# Patient Record
Sex: Male | Born: 1955 | Race: White | Hispanic: No | Marital: Married | Smoking: Never smoker
Health system: Southern US, Community
[De-identification: ages and names within clinical notes are randomized; demographics above are authoritative.]

## PROBLEM LIST (undated history)

## (undated) DIAGNOSIS — C61 Malignant neoplasm of prostate: Secondary | ICD-10-CM

## (undated) DIAGNOSIS — Z8579 Personal history of other malignant neoplasms of lymphoid, hematopoietic and related tissues: Secondary | ICD-10-CM

## (undated) DIAGNOSIS — C8590 Non-Hodgkin lymphoma, unspecified, unspecified site: Secondary | ICD-10-CM

## (undated) DIAGNOSIS — I10 Essential (primary) hypertension: Secondary | ICD-10-CM

## (undated) HISTORY — DX: Non-Hodgkin lymphoma, unspecified, unspecified site: C85.90

## (undated) HISTORY — DX: Essential (primary) hypertension: I10

## (undated) HISTORY — DX: Personal history of other malignant neoplasms of lymphoid, hematopoietic and related tissues: Z85.79

## (undated) HISTORY — DX: Malignant neoplasm of prostate: C61

## (undated) HISTORY — PX: PROSTATECTOMY: SHX69

---

## 1998-10-13 DIAGNOSIS — C61 Malignant neoplasm of prostate: Secondary | ICD-10-CM

## 1998-10-13 HISTORY — DX: Malignant neoplasm of prostate: C61

## 1998-10-24 ENCOUNTER — Other Ambulatory Visit: Admission: RE | Admit: 1998-10-24 | Discharge: 1998-10-24 | Payer: Self-pay | Admitting: Urology

## 1998-11-16 ENCOUNTER — Encounter: Payer: Self-pay | Admitting: Urology

## 1998-11-21 ENCOUNTER — Inpatient Hospital Stay (HOSPITAL_COMMUNITY): Admission: RE | Admit: 1998-11-21 | Discharge: 1998-11-25 | Payer: Self-pay | Admitting: Urology

## 2005-10-08 ENCOUNTER — Ambulatory Visit (HOSPITAL_COMMUNITY): Admission: RE | Admit: 2005-10-08 | Discharge: 2005-10-08 | Payer: Self-pay | Admitting: Psychiatry

## 2007-10-14 DIAGNOSIS — C8590 Non-Hodgkin lymphoma, unspecified, unspecified site: Secondary | ICD-10-CM

## 2007-10-14 HISTORY — DX: Non-Hodgkin lymphoma, unspecified, unspecified site: C85.90

## 2008-06-29 ENCOUNTER — Encounter: Admission: RE | Admit: 2008-06-29 | Discharge: 2008-06-29 | Payer: Self-pay | Admitting: Internal Medicine

## 2008-06-30 ENCOUNTER — Ambulatory Visit (HOSPITAL_COMMUNITY): Admission: RE | Admit: 2008-06-30 | Discharge: 2008-06-30 | Payer: Self-pay | Admitting: Internal Medicine

## 2008-06-30 ENCOUNTER — Encounter (INDEPENDENT_AMBULATORY_CARE_PROVIDER_SITE_OTHER): Payer: Self-pay | Admitting: Internal Medicine

## 2008-06-30 ENCOUNTER — Encounter (INDEPENDENT_AMBULATORY_CARE_PROVIDER_SITE_OTHER): Payer: Self-pay | Admitting: Interventional Radiology

## 2008-07-07 ENCOUNTER — Ambulatory Visit: Payer: Self-pay | Admitting: Hematology

## 2008-07-10 ENCOUNTER — Inpatient Hospital Stay (HOSPITAL_COMMUNITY): Admission: RE | Admit: 2008-07-10 | Discharge: 2008-07-12 | Payer: Self-pay | Admitting: Surgery

## 2008-07-10 ENCOUNTER — Encounter (INDEPENDENT_AMBULATORY_CARE_PROVIDER_SITE_OTHER): Payer: Self-pay | Admitting: Surgery

## 2008-07-13 ENCOUNTER — Ambulatory Visit: Payer: Self-pay | Admitting: Oncology

## 2008-07-13 ENCOUNTER — Encounter: Payer: Self-pay | Admitting: Oncology

## 2008-07-13 ENCOUNTER — Ambulatory Visit (HOSPITAL_COMMUNITY): Admission: RE | Admit: 2008-07-13 | Discharge: 2008-07-13 | Payer: Self-pay | Admitting: Oncology

## 2008-07-13 LAB — CBC WITH DIFFERENTIAL/PLATELET
Basophils Absolute: 0 10*3/uL (ref 0.0–0.1)
EOS%: 1.4 % (ref 0.0–7.0)
LYMPH%: 8 % — ABNORMAL LOW (ref 14.0–48.0)
MCH: 28.9 pg (ref 28.0–33.4)
MCV: 84.6 fL (ref 81.6–98.0)
MONO%: 6.3 % (ref 0.0–13.0)
RBC: 4.75 10*6/uL (ref 4.20–5.71)
RDW: 13 % (ref 11.2–14.6)

## 2008-07-14 LAB — HEPATITIS B SURFACE ANTIBODY,QUALITATIVE: Hep B S Ab: NEGATIVE

## 2008-07-14 LAB — COMPREHENSIVE METABOLIC PANEL
ALT: 15 U/L (ref 0–53)
Albumin: 3.3 g/dL — ABNORMAL LOW (ref 3.5–5.2)
CO2: 26 mEq/L (ref 19–32)
Calcium: 8.9 mg/dL (ref 8.4–10.5)
Chloride: 105 mEq/L (ref 96–112)
Potassium: 3.9 mEq/L (ref 3.5–5.3)
Sodium: 140 mEq/L (ref 135–145)
Total Protein: 5.5 g/dL — ABNORMAL LOW (ref 6.0–8.3)

## 2008-07-14 LAB — URIC ACID: Uric Acid, Serum: 10 mg/dL — ABNORMAL HIGH (ref 4.0–7.8)

## 2008-07-14 LAB — LACTATE DEHYDROGENASE: LDH: 196 U/L (ref 94–250)

## 2008-07-14 LAB — PSA: PSA: 0.02 ng/mL — ABNORMAL LOW (ref 0.10–4.00)

## 2008-07-14 LAB — HEPATITIS C ANTIBODY: HCV Ab: NEGATIVE

## 2008-07-17 ENCOUNTER — Ambulatory Visit (HOSPITAL_COMMUNITY): Admission: RE | Admit: 2008-07-17 | Discharge: 2008-07-17 | Payer: Self-pay | Admitting: Oncology

## 2008-07-17 LAB — BASIC METABOLIC PANEL
BUN: 12 mg/dL (ref 6–23)
CO2: 26 mEq/L (ref 19–32)
Chloride: 100 mEq/L (ref 96–112)
Creatinine, Ser: 1.07 mg/dL (ref 0.40–1.50)

## 2008-07-21 LAB — BASIC METABOLIC PANEL
BUN: 18 mg/dL (ref 6–23)
Glucose, Bld: 97 mg/dL (ref 70–99)
Potassium: 4.2 mEq/L (ref 3.5–5.3)

## 2008-07-21 LAB — PHOSPHORUS: Phosphorus: 2.7 mg/dL (ref 2.3–4.6)

## 2008-07-24 LAB — BASIC METABOLIC PANEL
CO2: 23 mEq/L (ref 19–32)
Chloride: 102 mEq/L (ref 96–112)
Glucose, Bld: 93 mg/dL (ref 70–99)
Potassium: 3.9 mEq/L (ref 3.5–5.3)
Sodium: 137 mEq/L (ref 135–145)

## 2008-07-26 LAB — COMPREHENSIVE METABOLIC PANEL
Albumin: 4.1 g/dL (ref 3.5–5.2)
Alkaline Phosphatase: 64 U/L (ref 39–117)
BUN: 11 mg/dL (ref 6–23)
CO2: 26 mEq/L (ref 19–32)
Glucose, Bld: 91 mg/dL (ref 70–99)
Potassium: 4.7 mEq/L (ref 3.5–5.3)
Sodium: 139 mEq/L (ref 135–145)
Total Protein: 6.3 g/dL (ref 6.0–8.3)

## 2008-07-26 LAB — CBC WITH DIFFERENTIAL/PLATELET
Basophils Absolute: 0 10*3/uL (ref 0.0–0.1)
Eosinophils Absolute: 0.1 10*3/uL (ref 0.0–0.5)
HGB: 13.6 g/dL (ref 13.0–17.1)
LYMPH%: 12.4 % — ABNORMAL LOW (ref 14.0–48.0)
MCV: 84.4 fL (ref 81.6–98.0)
MONO#: 0.6 10*3/uL (ref 0.1–0.9)
MONO%: 6.2 % (ref 0.0–13.0)
NEUT#: 7.1 10*3/uL — ABNORMAL HIGH (ref 1.5–6.5)
Platelets: 313 10*3/uL (ref 145–400)
WBC: 8.9 10*3/uL (ref 4.0–10.0)

## 2008-07-26 LAB — PHOSPHORUS: Phosphorus: 4.2 mg/dL (ref 2.3–4.6)

## 2008-08-02 LAB — CBC WITH DIFFERENTIAL/PLATELET
EOS%: 1.8 % (ref 0.0–7.0)
Eosinophils Absolute: 0.1 10*3/uL (ref 0.0–0.5)
LYMPH%: 32.7 % (ref 14.0–48.0)
MCH: 28.8 pg (ref 28.0–33.4)
MCHC: 34.6 g/dL (ref 32.0–35.9)
MCV: 83.2 fL (ref 81.6–98.0)
MONO%: 12.5 % (ref 0.0–13.0)
NEUT#: 2.7 10*3/uL (ref 1.5–6.5)
Platelets: 246 10*3/uL (ref 145–400)
RBC: 4.59 10*6/uL (ref 4.20–5.71)
RDW: 13.1 % (ref 11.2–14.6)

## 2008-08-02 LAB — LACTATE DEHYDROGENASE: LDH: 126 U/L (ref 94–250)

## 2008-08-02 LAB — PHOSPHORUS: Phosphorus: 4 mg/dL (ref 2.3–4.6)

## 2008-08-09 LAB — LACTATE DEHYDROGENASE: LDH: 179 U/L (ref 94–250)

## 2008-08-09 LAB — CBC WITH DIFFERENTIAL/PLATELET
BASO%: 1.4 % (ref 0.0–2.0)
Basophils Absolute: 0.1 10*3/uL (ref 0.0–0.1)
EOS%: 2.4 % (ref 0.0–7.0)
HGB: 13.7 g/dL (ref 13.0–17.1)
MCH: 28.1 pg (ref 28.0–33.4)
MCHC: 34.5 g/dL (ref 32.0–35.9)
MCV: 81.4 fL — ABNORMAL LOW (ref 81.6–98.0)
MONO%: 8 % (ref 0.0–13.0)
NEUT%: 56.1 % (ref 40.0–75.0)
RDW: 12.7 % (ref 11.2–14.6)
lymph#: 2.4 10*3/uL (ref 0.9–3.3)

## 2008-08-09 LAB — COMPREHENSIVE METABOLIC PANEL
ALT: 43 U/L (ref 0–53)
AST: 31 U/L (ref 0–37)
Alkaline Phosphatase: 84 U/L (ref 39–117)
CO2: 25 mEq/L (ref 19–32)
Sodium: 142 mEq/L (ref 135–145)
Total Bilirubin: 0.5 mg/dL (ref 0.3–1.2)
Total Protein: 7.3 g/dL (ref 6.0–8.3)

## 2008-08-21 LAB — CBC WITH DIFFERENTIAL/PLATELET
Eosinophils Absolute: 0.1 10*3/uL (ref 0.0–0.5)
MONO#: 0.6 10*3/uL (ref 0.1–0.9)
NEUT#: 3 10*3/uL (ref 1.5–6.5)
RBC: 4.74 10*6/uL (ref 4.20–5.71)
RDW: 14 % (ref 11.2–14.6)
WBC: 5.5 10*3/uL (ref 4.0–10.0)
lymph#: 1.7 10*3/uL (ref 0.9–3.3)

## 2008-08-21 LAB — COMPREHENSIVE METABOLIC PANEL
Albumin: 4.5 g/dL (ref 3.5–5.2)
CO2: 26 mEq/L (ref 19–32)
Glucose, Bld: 98 mg/dL (ref 70–99)
Potassium: 4.2 mEq/L (ref 3.5–5.3)
Sodium: 141 mEq/L (ref 135–145)
Total Protein: 7 g/dL (ref 6.0–8.3)

## 2008-08-21 LAB — URIC ACID: Uric Acid, Serum: 3.6 mg/dL — ABNORMAL LOW (ref 4.0–7.8)

## 2008-08-28 ENCOUNTER — Ambulatory Visit: Payer: Self-pay | Admitting: Hematology

## 2008-08-30 LAB — CBC WITH DIFFERENTIAL/PLATELET
BASO%: 1.4 % (ref 0.0–2.0)
EOS%: 2.1 % (ref 0.0–7.0)
HCT: 38.6 % — ABNORMAL LOW (ref 38.7–49.9)
LYMPH%: 33.5 % (ref 14.0–48.0)
MCH: 28.8 pg (ref 28.0–33.4)
MCHC: 35 g/dL (ref 32.0–35.9)
MCV: 82.2 fL (ref 81.6–98.0)
NEUT%: 55.2 % (ref 40.0–75.0)
Platelets: 233 10*3/uL (ref 145–400)

## 2008-08-30 LAB — COMPREHENSIVE METABOLIC PANEL
ALT: 43 U/L (ref 0–53)
Alkaline Phosphatase: 56 U/L (ref 39–117)
Creatinine, Ser: 0.87 mg/dL (ref 0.40–1.50)
Glucose, Bld: 102 mg/dL — ABNORMAL HIGH (ref 70–99)
Sodium: 140 mEq/L (ref 135–145)
Total Bilirubin: 0.5 mg/dL (ref 0.3–1.2)
Total Protein: 6 g/dL (ref 6.0–8.3)

## 2008-08-30 LAB — URIC ACID: Uric Acid, Serum: 3.6 mg/dL — ABNORMAL LOW (ref 4.0–7.8)

## 2008-09-20 LAB — CBC WITH DIFFERENTIAL/PLATELET
Basophils Absolute: 0.1 10*3/uL (ref 0.0–0.1)
HCT: 39.7 % (ref 38.7–49.9)
HGB: 14.1 g/dL (ref 13.0–17.1)
MONO#: 0.5 10*3/uL (ref 0.1–0.9)
NEUT#: 3.5 10*3/uL (ref 1.5–6.5)
NEUT%: 56.5 % (ref 40.0–75.0)
RDW: 13.8 % (ref 11.2–14.6)
WBC: 6.2 10*3/uL (ref 4.0–10.0)
lymph#: 2 10*3/uL (ref 0.9–3.3)

## 2008-09-20 LAB — COMPREHENSIVE METABOLIC PANEL
ALT: 47 U/L (ref 0–53)
Albumin: 4.4 g/dL (ref 3.5–5.2)
BUN: 10 mg/dL (ref 6–23)
CO2: 24 mEq/L (ref 19–32)
Calcium: 9.2 mg/dL (ref 8.4–10.5)
Chloride: 107 mEq/L (ref 96–112)
Creatinine, Ser: 0.91 mg/dL (ref 0.40–1.50)
Potassium: 3.9 mEq/L (ref 3.5–5.3)

## 2008-10-09 ENCOUNTER — Ambulatory Visit: Payer: Self-pay | Admitting: Hematology

## 2008-10-11 LAB — CBC WITH DIFFERENTIAL/PLATELET
BASO%: 1.4 % (ref 0.0–2.0)
EOS%: 1.9 % (ref 0.0–7.0)
HCT: 38.2 % — ABNORMAL LOW (ref 38.7–49.9)
MCH: 29.1 pg (ref 28.0–33.4)
MCHC: 35.7 g/dL (ref 32.0–35.9)
MONO#: 0.6 10*3/uL (ref 0.1–0.9)
NEUT%: 63.6 % (ref 40.0–75.0)
RDW: 13.4 % (ref 11.2–14.6)
WBC: 6.8 10*3/uL (ref 4.0–10.0)
lymph#: 1.7 10*3/uL (ref 0.9–3.3)

## 2008-10-11 LAB — COMPREHENSIVE METABOLIC PANEL
ALT: 35 U/L (ref 0–53)
AST: 19 U/L (ref 0–37)
Albumin: 4.8 g/dL (ref 3.5–5.2)
CO2: 25 mEq/L (ref 19–32)
Calcium: 9.1 mg/dL (ref 8.4–10.5)
Chloride: 105 mEq/L (ref 96–112)
Creatinine, Ser: 0.96 mg/dL (ref 0.40–1.50)
Potassium: 3.8 mEq/L (ref 3.5–5.3)
Sodium: 143 mEq/L (ref 135–145)
Total Protein: 7.1 g/dL (ref 6.0–8.3)

## 2008-10-11 LAB — LACTATE DEHYDROGENASE: LDH: 161 U/L (ref 94–250)

## 2008-11-29 ENCOUNTER — Encounter: Admission: RE | Admit: 2008-11-29 | Discharge: 2008-11-29 | Payer: Self-pay | Admitting: Oncology

## 2008-11-29 ENCOUNTER — Ambulatory Visit: Payer: Self-pay | Admitting: Oncology

## 2008-12-05 ENCOUNTER — Ambulatory Visit (HOSPITAL_COMMUNITY): Admission: RE | Admit: 2008-12-05 | Discharge: 2008-12-05 | Payer: Self-pay | Admitting: Oncology

## 2008-12-05 LAB — COMPREHENSIVE METABOLIC PANEL
Albumin: 4.5 g/dL (ref 3.5–5.2)
Alkaline Phosphatase: 61 U/L (ref 39–117)
BUN: 12 mg/dL (ref 6–23)
CO2: 25 mEq/L (ref 19–32)
Calcium: 9.4 mg/dL (ref 8.4–10.5)
Chloride: 107 mEq/L (ref 96–112)
Glucose, Bld: 96 mg/dL (ref 70–99)
Potassium: 4.5 mEq/L (ref 3.5–5.3)
Sodium: 143 mEq/L (ref 135–145)
Total Protein: 6.6 g/dL (ref 6.0–8.3)

## 2008-12-05 LAB — CBC WITH DIFFERENTIAL/PLATELET
Basophils Absolute: 0 10*3/uL (ref 0.0–0.1)
Eosinophils Absolute: 0.1 10*3/uL (ref 0.0–0.5)
HGB: 14 g/dL (ref 13.0–17.1)
MONO#: 0.5 10*3/uL (ref 0.1–0.9)
MONO%: 7.2 % (ref 0.0–14.0)
NEUT#: 4.9 10*3/uL (ref 1.5–6.5)
RBC: 4.67 10*6/uL (ref 4.20–5.82)
RDW: 13.6 % (ref 11.0–14.6)
WBC: 7.2 10*3/uL (ref 4.0–10.3)
lymph#: 1.7 10*3/uL (ref 0.9–3.3)

## 2008-12-05 LAB — PSA: PSA: 0.01 ng/mL — ABNORMAL LOW (ref 0.10–4.00)

## 2008-12-15 LAB — CBC WITH DIFFERENTIAL/PLATELET
BASO%: 1 % (ref 0.0–2.0)
Basophils Absolute: 0.1 10*3/uL (ref 0.0–0.1)
EOS%: 2.2 % (ref 0.0–7.0)
HGB: 14.1 g/dL (ref 13.0–17.1)
MCH: 29.1 pg (ref 27.2–33.4)
MCHC: 35.3 g/dL (ref 32.0–36.0)
MCV: 82.3 fL (ref 79.3–98.0)
MONO%: 9.7 % (ref 0.0–14.0)
RDW: 12.9 % (ref 11.0–14.6)
lymph#: 1.8 10*3/uL (ref 0.9–3.3)

## 2008-12-15 LAB — COMPREHENSIVE METABOLIC PANEL
ALT: 21 U/L (ref 0–53)
AST: 19 U/L (ref 0–37)
Albumin: 4.5 g/dL (ref 3.5–5.2)
Alkaline Phosphatase: 49 U/L (ref 39–117)
Calcium: 9.2 mg/dL (ref 8.4–10.5)
Chloride: 109 mEq/L (ref 96–112)
Potassium: 4.6 mEq/L (ref 3.5–5.3)
Sodium: 141 mEq/L (ref 135–145)
Total Protein: 6.7 g/dL (ref 6.0–8.3)

## 2008-12-22 LAB — COMPREHENSIVE METABOLIC PANEL
ALT: 24 U/L (ref 0–53)
AST: 17 U/L (ref 0–37)
Alkaline Phosphatase: 56 U/L (ref 39–117)
Creatinine, Ser: 0.92 mg/dL (ref 0.40–1.50)
Sodium: 141 mEq/L (ref 135–145)
Total Bilirubin: 0.5 mg/dL (ref 0.3–1.2)
Total Protein: 6.6 g/dL (ref 6.0–8.3)

## 2008-12-22 LAB — CBC WITH DIFFERENTIAL/PLATELET
BASO%: 0.4 % (ref 0.0–2.0)
LYMPH%: 25 % (ref 14.0–49.0)
MCHC: 35.4 g/dL (ref 32.0–36.0)
MCV: 82.2 fL (ref 79.3–98.0)
MONO#: 0.3 10*3/uL (ref 0.1–0.9)
MONO%: 5.3 % (ref 0.0–14.0)
NEUT#: 3.6 10*3/uL (ref 1.5–6.5)
Platelets: 175 10*3/uL (ref 140–400)
RBC: 4.84 10*6/uL (ref 4.20–5.82)
RDW: 12.8 % (ref 11.0–14.6)
WBC: 5.3 10*3/uL (ref 4.0–10.3)
nRBC: 0 % (ref 0–0)

## 2008-12-29 LAB — COMPREHENSIVE METABOLIC PANEL
ALT: 30 U/L (ref 0–53)
AST: 17 U/L (ref 0–37)
Alkaline Phosphatase: 63 U/L (ref 39–117)
BUN: 13 mg/dL (ref 6–23)
Creatinine, Ser: 0.96 mg/dL (ref 0.40–1.50)

## 2008-12-29 LAB — CBC WITH DIFFERENTIAL/PLATELET
Basophils Absolute: 0 10*3/uL (ref 0.0–0.1)
EOS%: 2.2 % (ref 0.0–7.0)
HCT: 40.1 % (ref 38.4–49.9)
HGB: 14.2 g/dL (ref 13.0–17.1)
LYMPH%: 27.4 % (ref 14.0–49.0)
MCH: 28.9 pg (ref 27.2–33.4)
MCV: 81.5 fL (ref 79.3–98.0)
MONO%: 8.4 % (ref 0.0–14.0)
NEUT%: 61.3 % (ref 39.0–75.0)
Platelets: 186 10*3/uL (ref 140–400)

## 2009-01-05 LAB — COMPREHENSIVE METABOLIC PANEL
Albumin: 4.4 g/dL (ref 3.5–5.2)
Alkaline Phosphatase: 55 U/L (ref 39–117)
BUN: 12 mg/dL (ref 6–23)
Creatinine, Ser: 0.99 mg/dL (ref 0.40–1.50)
Glucose, Bld: 112 mg/dL — ABNORMAL HIGH (ref 70–99)
Potassium: 4.1 mEq/L (ref 3.5–5.3)
Total Bilirubin: 0.7 mg/dL (ref 0.3–1.2)

## 2009-01-05 LAB — CBC WITH DIFFERENTIAL/PLATELET
Basophils Absolute: 0 10*3/uL (ref 0.0–0.1)
Eosinophils Absolute: 0.1 10*3/uL (ref 0.0–0.5)
HGB: 14.5 g/dL (ref 13.0–17.1)
MCV: 81.8 fL (ref 79.3–98.0)
MONO%: 5.8 % (ref 0.0–14.0)
NEUT#: 3.4 10*3/uL (ref 1.5–6.5)
RDW: 12.4 % (ref 11.0–14.6)

## 2009-01-08 LAB — CBC WITH DIFFERENTIAL/PLATELET
Eosinophils Absolute: 0.1 10*3/uL (ref 0.0–0.5)
HCT: 41.6 % (ref 38.4–49.9)
LYMPH%: 27.5 % (ref 14.0–49.0)
MONO#: 0.4 10*3/uL (ref 0.1–0.9)
NEUT#: 3.4 10*3/uL (ref 1.5–6.5)
NEUT%: 63.8 % (ref 39.0–75.0)
Platelets: 190 10*3/uL (ref 140–400)
WBC: 5.4 10*3/uL (ref 4.0–10.3)

## 2009-01-08 LAB — COMPREHENSIVE METABOLIC PANEL
Albumin: 4.8 g/dL (ref 3.5–5.2)
BUN: 9 mg/dL (ref 6–23)
Calcium: 9.5 mg/dL (ref 8.4–10.5)
Chloride: 107 mEq/L (ref 96–112)
Creatinine, Ser: 0.98 mg/dL (ref 0.40–1.50)
Glucose, Bld: 114 mg/dL — ABNORMAL HIGH (ref 70–99)
Potassium: 4.3 mEq/L (ref 3.5–5.3)

## 2009-01-08 LAB — LACTATE DEHYDROGENASE: LDH: 133 U/L (ref 94–250)

## 2009-04-11 ENCOUNTER — Ambulatory Visit: Payer: Self-pay | Admitting: Oncology

## 2009-04-13 LAB — COMPREHENSIVE METABOLIC PANEL
AST: 22 U/L (ref 0–37)
Albumin: 4.4 g/dL (ref 3.5–5.2)
BUN: 11 mg/dL (ref 6–23)
CO2: 23 mEq/L (ref 19–32)
Calcium: 9.1 mg/dL (ref 8.4–10.5)
Chloride: 109 mEq/L (ref 96–112)
Creatinine, Ser: 0.94 mg/dL (ref 0.40–1.50)
Glucose, Bld: 97 mg/dL (ref 70–99)
Potassium: 4.3 mEq/L (ref 3.5–5.3)

## 2009-04-13 LAB — CBC WITH DIFFERENTIAL/PLATELET
Eosinophils Absolute: 0.1 10*3/uL (ref 0.0–0.5)
MONO#: 0.4 10*3/uL (ref 0.1–0.9)
NEUT#: 3.8 10*3/uL (ref 1.5–6.5)
RBC: 5.06 10*6/uL (ref 4.20–5.82)
RDW: 13.4 % (ref 11.0–14.6)
WBC: 5.8 10*3/uL (ref 4.0–10.3)
lymph#: 1.5 10*3/uL (ref 0.9–3.3)

## 2009-04-13 LAB — LACTATE DEHYDROGENASE: LDH: 128 U/L (ref 94–250)

## 2009-04-17 ENCOUNTER — Encounter: Admission: RE | Admit: 2009-04-17 | Discharge: 2009-04-17 | Payer: Self-pay | Admitting: Oncology

## 2009-06-20 ENCOUNTER — Ambulatory Visit: Payer: Self-pay | Admitting: Oncology

## 2009-06-22 LAB — COMPREHENSIVE METABOLIC PANEL
Albumin: 4.7 g/dL (ref 3.5–5.2)
Alkaline Phosphatase: 61 U/L (ref 39–117)
BUN: 10 mg/dL (ref 6–23)
Calcium: 9.3 mg/dL (ref 8.4–10.5)
Chloride: 109 mEq/L (ref 96–112)
Glucose, Bld: 91 mg/dL (ref 70–99)
Potassium: 4.2 mEq/L (ref 3.5–5.3)

## 2009-06-22 LAB — CBC WITH DIFFERENTIAL/PLATELET
BASO%: 0.5 % (ref 0.0–2.0)
Basophils Absolute: 0 10*3/uL (ref 0.0–0.1)
EOS%: 1.2 % (ref 0.0–7.0)
Eosinophils Absolute: 0.1 10*3/uL (ref 0.0–0.5)
HCT: 42.7 % (ref 38.4–49.9)
HGB: 15.4 g/dL (ref 13.0–17.1)
LYMPH%: 25.3 % (ref 14.0–49.0)
MCH: 29.7 pg (ref 27.2–33.4)
MCHC: 36.1 g/dL — ABNORMAL HIGH (ref 32.0–36.0)
MCV: 82.4 fL (ref 79.3–98.0)
MONO#: 0.4 10*3/uL (ref 0.1–0.9)
MONO%: 6.6 % (ref 0.0–14.0)
NEUT#: 4 10*3/uL (ref 1.5–6.5)
NEUT%: 66.4 % (ref 39.0–75.0)
Platelets: 198 10*3/uL (ref 140–400)
RBC: 5.18 10*6/uL (ref 4.20–5.82)
RDW: 12.5 % (ref 11.0–14.6)
WBC: 6.1 10*3/uL (ref 4.0–10.3)
lymph#: 1.5 10*3/uL (ref 0.9–3.3)
nRBC: 0 % (ref 0–0)

## 2009-06-22 LAB — LACTATE DEHYDROGENASE: LDH: 134 U/L (ref 94–250)

## 2009-09-11 ENCOUNTER — Encounter: Admission: RE | Admit: 2009-09-11 | Discharge: 2009-09-11 | Payer: Self-pay | Admitting: Oncology

## 2009-09-11 ENCOUNTER — Ambulatory Visit: Payer: Self-pay | Admitting: Oncology

## 2009-09-14 LAB — CBC WITH DIFFERENTIAL/PLATELET
BASO%: 0.4 % (ref 0.0–2.0)
EOS%: 1.4 % (ref 0.0–7.0)
MCH: 29.7 pg (ref 27.2–33.4)
MCHC: 34.8 g/dL (ref 32.0–36.0)
RBC: 5.16 10*6/uL (ref 4.20–5.82)
RDW: 12.7 % (ref 11.0–14.6)
lymph#: 1.5 10*3/uL (ref 0.9–3.3)

## 2009-09-14 LAB — COMPREHENSIVE METABOLIC PANEL
ALT: 34 U/L (ref 0–53)
AST: 20 U/L (ref 0–37)
Albumin: 4.8 g/dL (ref 3.5–5.2)
Calcium: 9.4 mg/dL (ref 8.4–10.5)
Chloride: 107 mEq/L (ref 96–112)
Potassium: 4.3 mEq/L (ref 3.5–5.3)

## 2009-09-14 LAB — LACTATE DEHYDROGENASE: LDH: 121 U/L (ref 94–250)

## 2009-09-16 IMAGING — PT NM PET TUM IMG SKULL BASE T - THIGH
6 series · 25 of 25 positions shown · non-contrast
Comparison: CT abdomen pelvis 06/29/2008 and CT chest 07/17/2008

CLINICAL DATA: Non-Hodgkins lymphoma.

NUCLEAR MEDICINE PET SKULL BASE TO THIGH
TECHNIQUE: 18.0 mCi F-18 FDG was injected intravenously via the
right antecubital fossa..  Full-ring PET imaging was performed from
the skull base through the mid-thighs 70  minutes after injection.
CT data was obtained and used for attenuation correction and
anatomic localization only.  (This was not acquired as a diagnostic
CT examination.)
Fasting Blood Glucose:  146

[Series 1: pet ac · axial · 3.3mm · 4.69mm/px · z∈[-899,-29]mm · 5 of 267 slices shown]
[im 1/267]
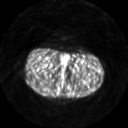
[im 67/267]
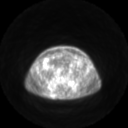
[im 134/267]
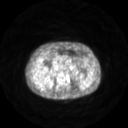
[im 200/267]
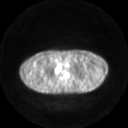
[im 267/267]
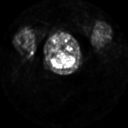

[Series 2: pet nac · axial · 3.3mm · 4.69mm/px · z∈[-899,-29]mm · 6 of 267 slices shown]
[im 1/267]
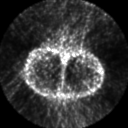
[im 54/267]
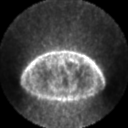
[im 107/267]
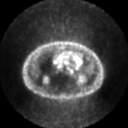
[im 160/267]
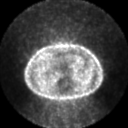
[im 213/267]
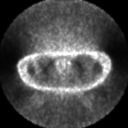
[im 267/267]
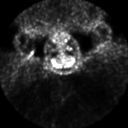

[Series 2: ct images · axial · 3.8mm · 0.98mm/px · z∈[-899,-30]mm · 5 of 267 slices shown]
[im 1/267]
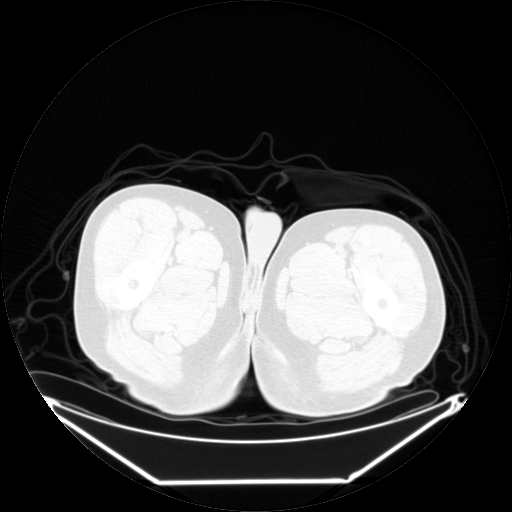
[im 67/267]
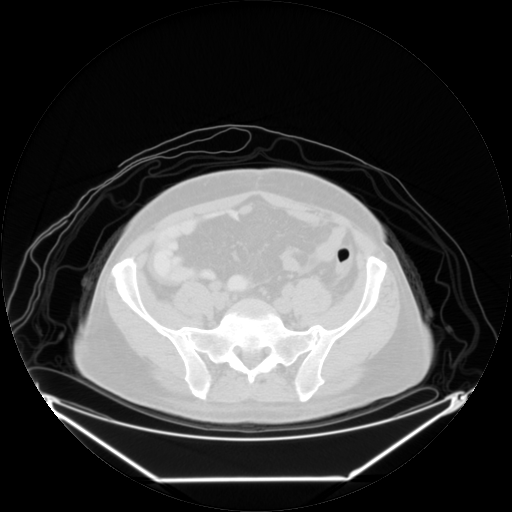
[im 134/267]
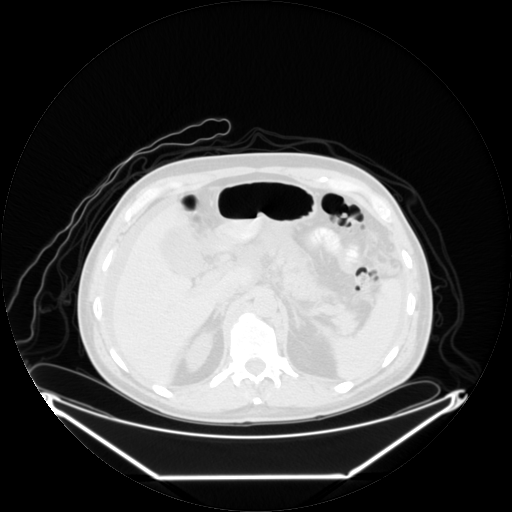
[im 200/267]
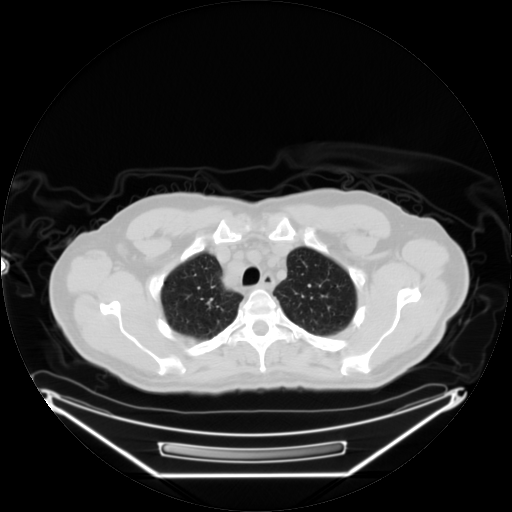
[im 267/267]
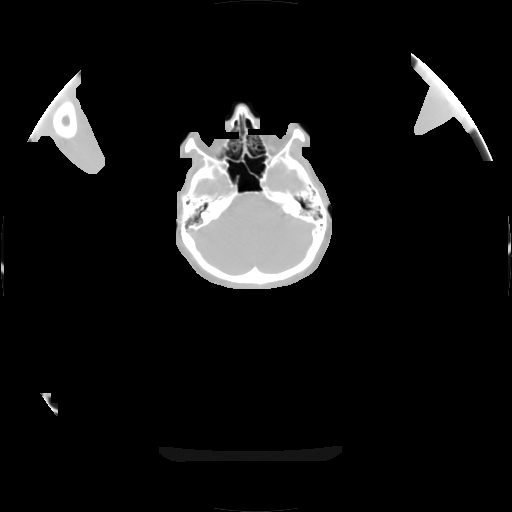

[Series 123: mip · coronal · 3.3mm · 4.69mm/px · 1 of 30 slices shown]
[im 1/30]
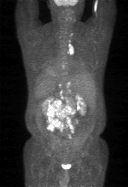

[Series 151: reformatted · axial · 3.3mm · 3.91mm/px · z∈[-899,-29]mm · 6 of 265 slices shown (1 of 2)]
[im 1/265]
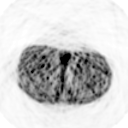
[im 53/265]
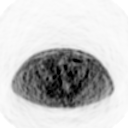
[im 106/265]
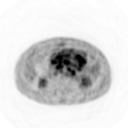
[im 159/265]
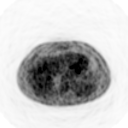
[im 212/265]
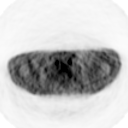
[im 265/265]
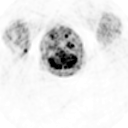

[Series 153: reformatted · coronal · 4.7mm · 6.98mm/px · 2 of 71 slices shown (2 of 2)]
[im 1/71]
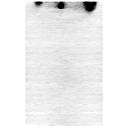
[im 71/71]
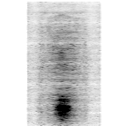

[25 of 25 positions shown; findings below may reference images not displayed]

FINDINGS: There are bilateral low internal jugular lymph nodes,
with maximum S U V seen on the right, measuring 4.8.  Mediastinal
adenopathy has a maximum S U V of 15.6, in the low right
paratracheal station.  Lymph node posterior to the left atrium,
between the esophagus and descending thoracic aorta, has an S U V
of 5.2.  Retrocrural adenopathy has maximum standard uptake value
of 11.1.  Focally increased uptake along the right arm is
presumably related to injection.

Bulky retroperitoneal adenopathy has maximum S U V of 15.9.  Large
mesenteric nodal mass has maximum S U V of 19.6. Mild uptake is
seen along the midline omentum, measuring 3.9.  This may be
postoperative as no corresponding adenopathy is visualized.  Right
common iliac chain lymph node has an S U V of 8.1.  There is mild
patchy increased uptake in the scrotal sac bilaterally.

CT images show bilateral pleural effusions, small in size, and
ascites.  Please refer to diagnostic CT chest performed the same
day, as well as diagnostic CT abdomen pelvis performed 06/29/2008,
for further details and discussion regarding the CT images.
IMPRESSION: 1.  Internal jugular, mediastinal, paraesophageal, retrocrural,
retroperitoneal, mesenteric, and right common iliac hypermetabolic
adenopathy is consistent with lymphoma.
2.  Mild patchy uptake in the scrotal sac bilaterally.  Ultrasound
may be helpful in further evaluation, as clinically indicated.
3.  Bilateral pleural effusions and ascites.
4.  Tiny right renal calculus without obstruction.
5.  Please refer to diagnostic CT chest performed the same day, as
well as diagnostic CT abdomen and pelvis performed 06/29/2008, for
further details and discussion regarding the CT images.

## 2009-09-16 IMAGING — CT CT CHEST W/ CM
2 of 3 series · 15 of 36 positions shown, 18 images · IV contrast (agent unspecified)
Comparison: None

CLINICAL DATA: Non-Hodgkin lymphoma.

CT CHEST WITH CONTRAST
TECHNIQUE: Multidetector CT imaging of the chest was performed
following the standard protocol during bolus administration of
intravenous contrast.
Contrast: 80 ml Hmnipaque-0WW

[Series 2: chest routine 5.0 b40f · axial · 0.74mm/px · z∈[-435,-150]mm · 12 of 69 slices shown, 15 images]
[im 6/69  mediastinal]
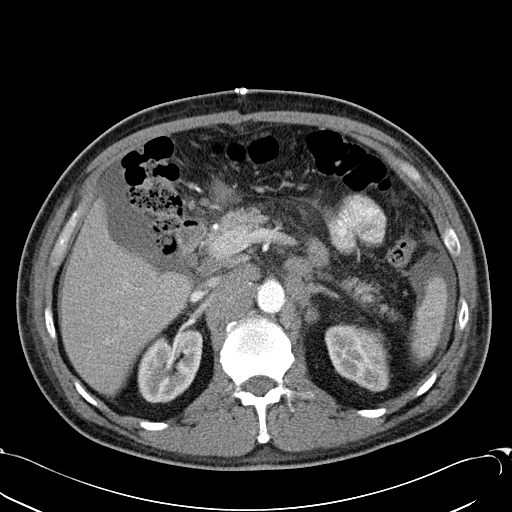
[im 6/69  lung]
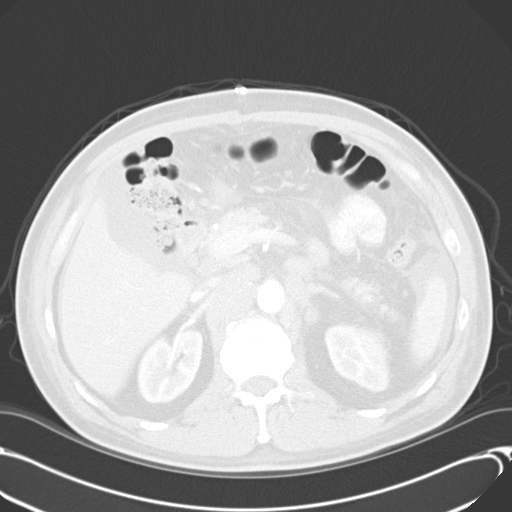
[im 11/69  lung]
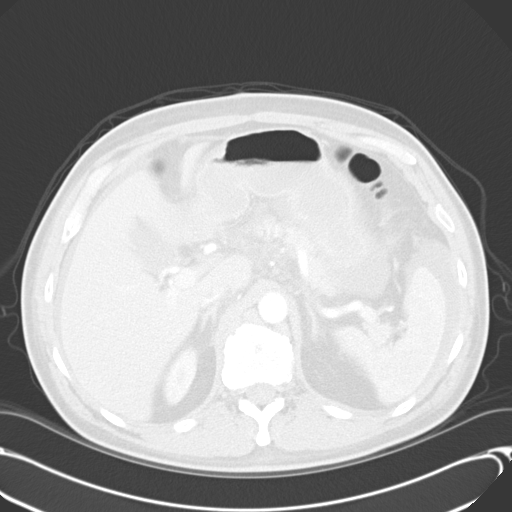
[im 16/69  lung]
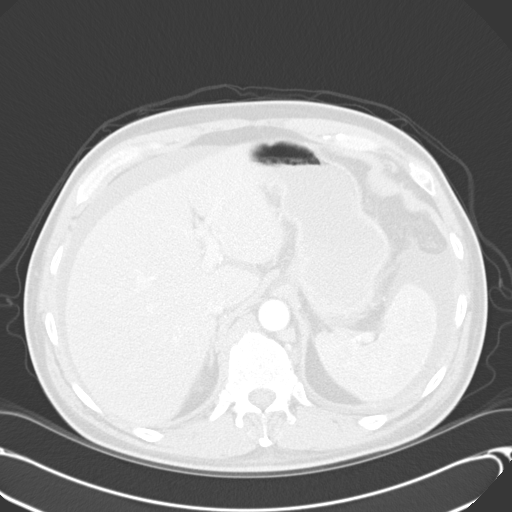
[im 21/69  lung]
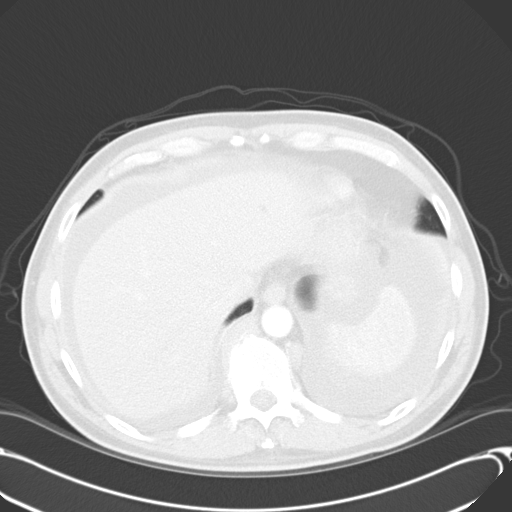
[im 26/69  mediastinal]
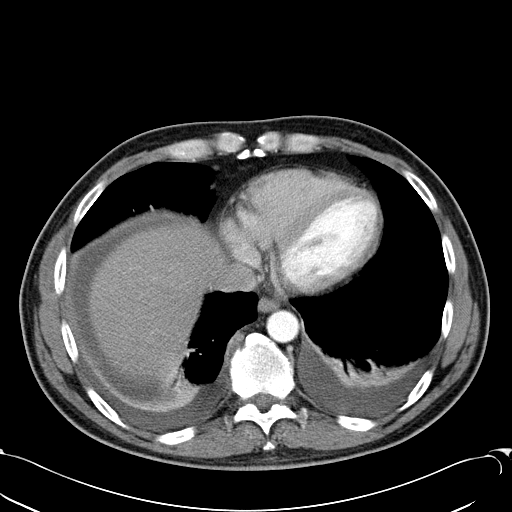
[im 26/69  lung]
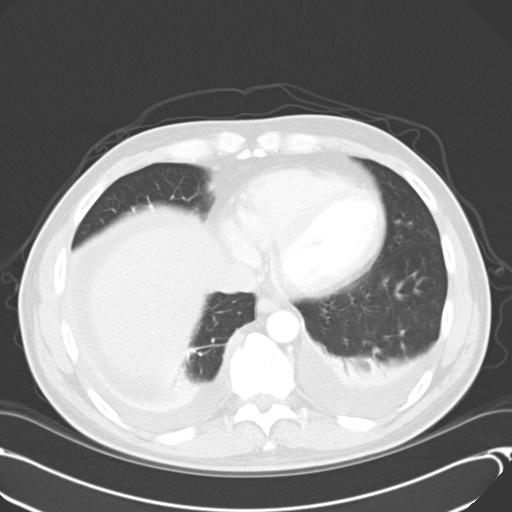
[im 31/69  lung]
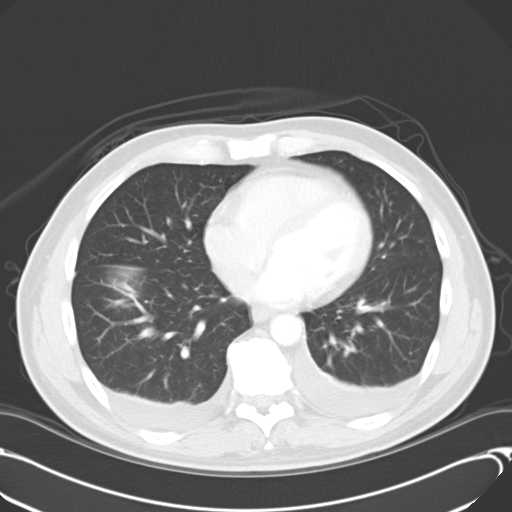
[im 38/69  lung]
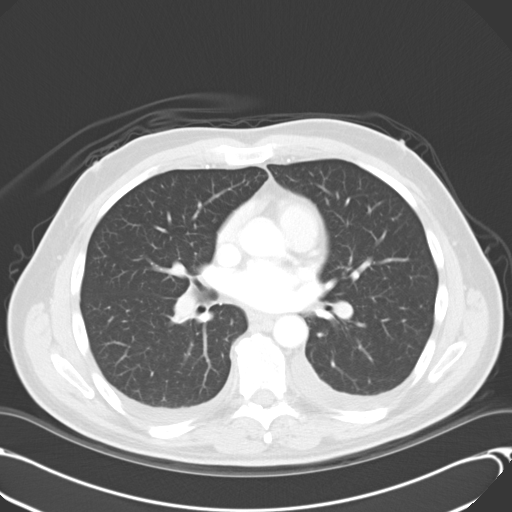
[im 43/69  lung]
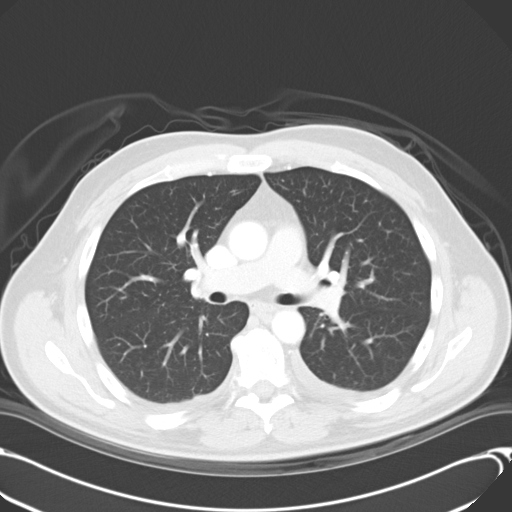
[im 48/69  mediastinal]
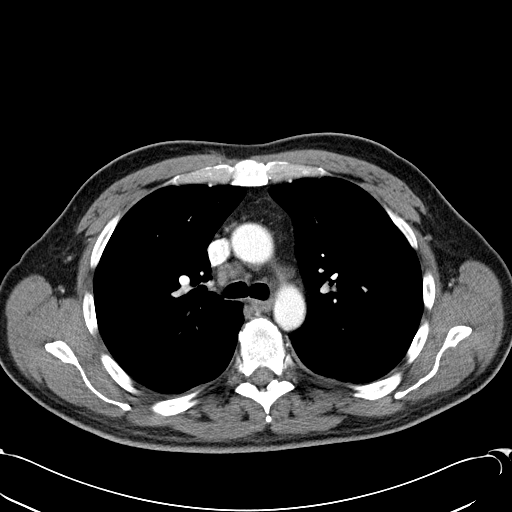
[im 48/69  lung]
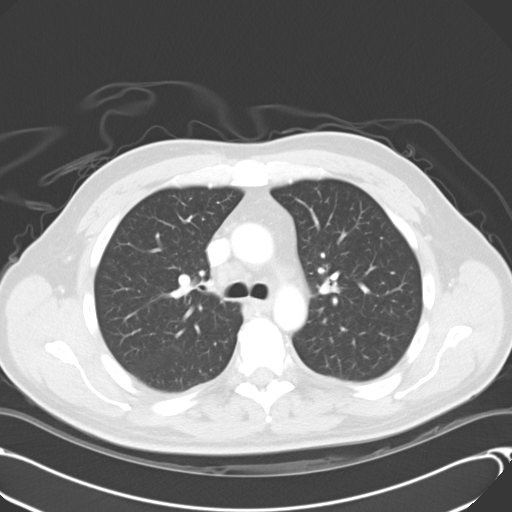
[im 53/69  lung]
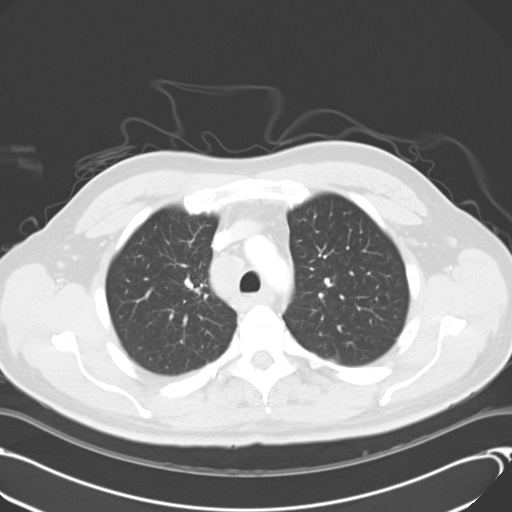
[im 58/69  lung]
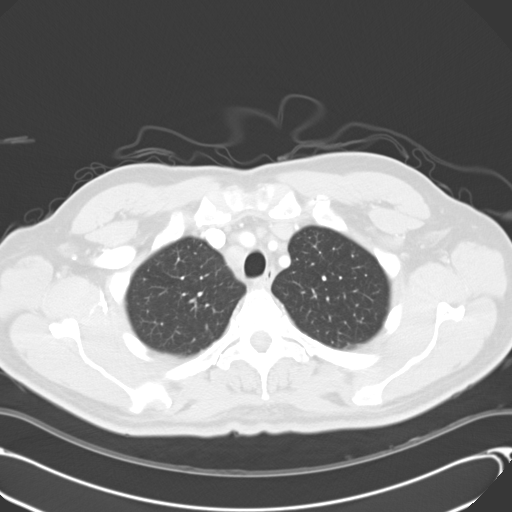
[im 63/69  lung]
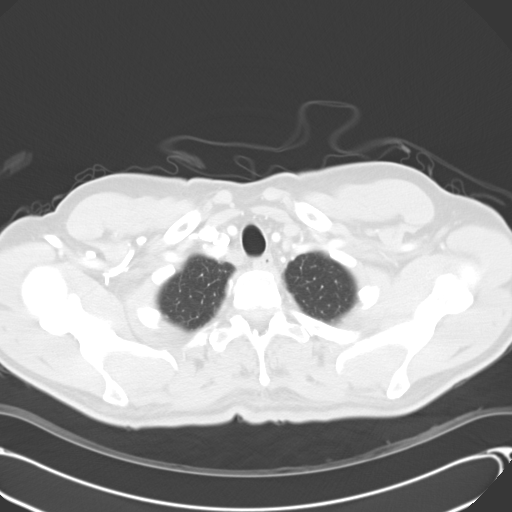

[Series 602: <mpr thick range> · coronal · 0.74mm/px · 3 of 83 slices shown]
[im 17/83  lung]
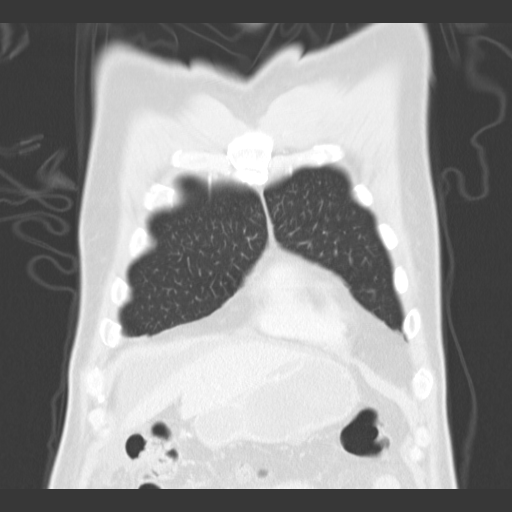
[im 33/83  lung]
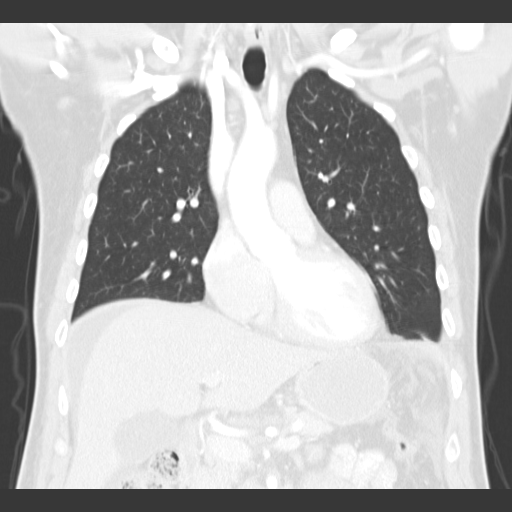
[im 50/83  lung]
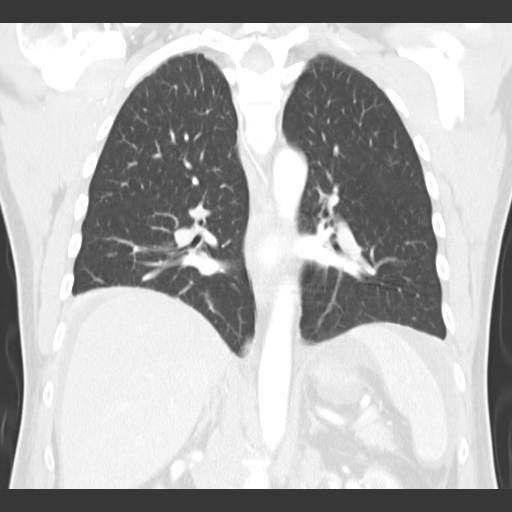

[15 of 36 positions shown; findings below may reference images not displayed]

FINDINGS: Low internal jugular lymph nodes measure 7 mm in short
axis on the right, and 1 cm on the left.  Mediastinal adenopathy
measures up to 2.9 x 2.2 cm in the right paratracheal station.
Lymph node posterior to the left atrium is situated between the
esophagus and descending thoracic aorta, and measures 9 mm in short
axis (image 31).  Extensive retrocrural adenopathy measures up to
1.5 cm short axis.

Heart size normal.  No pericardial effusion.  Small juxta
diaphragmatic lymph node measures 6 mm in short axis.  No axillary
adenopathy.

There are small bilateral pleural effusions, with compressive
atelectasis in the lower lobes bilaterally.  Mild biapical
scarring.  Lungs otherwise clear.  Airway unremarkable.

Incidental imaging of the upper abdomen reveals ascites.  A 5 x 8
mm low attenuation lesion in the left hepatic lobe is too small to
characterize.  Spleen is heterogeneous.  Gastrohepatic ligament
lymph nodes measure up to 8 mm in short axis.  There is extensive
retroperitoneal adenopathy, which is incompletely imaged.
Mesenteric nodal mass is also incompletely imaged.  No worrisome
lytic or sclerotic lesions.
IMPRESSION: 1.  Low internal jugular, mediastinal, paraesophageal, retrocrural,
retroperitoneal and mesenteric adenopathy is most consistent with
lymphoma. Please refer to PET CT performed the same day, as well as
CT abdomen pelvis performed 06/29/2008 for further details and
discussion.
2.  Small bilateral pleural effusions with compressive atelectasis
in the lower lobes.
3.  Ascites.
4.  Heterogeneous spleen.

## 2009-12-11 ENCOUNTER — Ambulatory Visit: Payer: Self-pay | Admitting: Oncology

## 2009-12-14 LAB — CBC WITH DIFFERENTIAL/PLATELET
Basophils Absolute: 0 10*3/uL (ref 0.0–0.1)
EOS%: 0.6 % (ref 0.0–7.0)
Eosinophils Absolute: 0.1 10*3/uL (ref 0.0–0.5)
LYMPH%: 16.6 % (ref 14.0–49.0)
MCH: 31 pg (ref 27.2–33.4)
MCHC: 35.3 g/dL (ref 32.0–36.0)
MCV: 87.9 fL (ref 79.3–98.0)
MONO#: 0.4 10*3/uL (ref 0.1–0.9)
MONO%: 5.2 % (ref 0.0–14.0)
Platelets: 189 10*3/uL (ref 140–400)
RBC: 5.23 10*6/uL (ref 4.20–5.82)
lymph#: 1.3 10*3/uL (ref 0.9–3.3)

## 2009-12-14 LAB — LACTATE DEHYDROGENASE: LDH: 110 U/L (ref 94–250)

## 2009-12-14 LAB — COMPREHENSIVE METABOLIC PANEL
AST: 16 U/L (ref 0–37)
Albumin: 4.8 g/dL (ref 3.5–5.2)
CO2: 21 mEq/L (ref 19–32)
Calcium: 9.2 mg/dL (ref 8.4–10.5)
Chloride: 109 mEq/L (ref 96–112)
Creatinine, Ser: 0.9 mg/dL (ref 0.40–1.50)
Total Protein: 6.9 g/dL (ref 6.0–8.3)

## 2010-04-11 ENCOUNTER — Encounter: Admission: RE | Admit: 2010-04-11 | Discharge: 2010-04-11 | Payer: Self-pay | Admitting: Oncology

## 2010-04-12 ENCOUNTER — Ambulatory Visit: Payer: Self-pay | Admitting: Oncology

## 2010-04-17 LAB — CBC WITH DIFFERENTIAL/PLATELET
Basophils Absolute: 0 10*3/uL (ref 0.0–0.1)
EOS%: 1 % (ref 0.0–7.0)
Eosinophils Absolute: 0.1 10*3/uL (ref 0.0–0.5)
HCT: 45.4 % (ref 38.4–49.9)
HGB: 15.8 g/dL (ref 13.0–17.1)
MCH: 30.9 pg (ref 27.2–33.4)
MCHC: 34.8 g/dL (ref 32.0–36.0)
NEUT%: 77.1 % — ABNORMAL HIGH (ref 39.0–75.0)
Platelets: 178 10*3/uL (ref 140–400)
lymph#: 1.3 10*3/uL (ref 0.9–3.3)

## 2010-04-17 LAB — COMPREHENSIVE METABOLIC PANEL
ALT: 18 U/L (ref 0–53)
AST: 14 U/L (ref 0–37)
Alkaline Phosphatase: 66 U/L (ref 39–117)
BUN: 12 mg/dL (ref 6–23)
Glucose, Bld: 116 mg/dL — ABNORMAL HIGH (ref 70–99)
Potassium: 4 mEq/L (ref 3.5–5.3)

## 2010-07-04 ENCOUNTER — Ambulatory Visit: Payer: Self-pay | Admitting: Oncology

## 2010-07-08 LAB — CBC WITH DIFFERENTIAL/PLATELET
BASO%: 0.6 % (ref 0.0–2.0)
EOS%: 3 % (ref 0.0–7.0)
Eosinophils Absolute: 0.2 10*3/uL (ref 0.0–0.5)
HCT: 42.8 % (ref 38.4–49.9)
HGB: 15.1 g/dL (ref 13.0–17.1)
LYMPH%: 23.9 % (ref 14.0–49.0)
MCHC: 35.3 g/dL (ref 32.0–36.0)
MCV: 85.6 fL (ref 79.3–98.0)
MONO#: 0.5 10*3/uL (ref 0.1–0.9)
MONO%: 6.7 % (ref 0.0–14.0)
NEUT%: 65.8 % (ref 39.0–75.0)
RBC: 5 10*6/uL (ref 4.20–5.82)
RDW: 12.8 % (ref 11.0–14.6)
WBC: 7 10*3/uL (ref 4.0–10.3)
nRBC: 0 % (ref 0–0)

## 2010-07-08 LAB — COMPREHENSIVE METABOLIC PANEL
AST: 15 U/L (ref 0–37)
CO2: 26 mEq/L (ref 19–32)
Chloride: 107 mEq/L (ref 96–112)
Glucose, Bld: 77 mg/dL (ref 70–99)
Sodium: 141 mEq/L (ref 135–145)
Total Protein: 6.3 g/dL (ref 6.0–8.3)

## 2010-07-08 LAB — LACTATE DEHYDROGENASE: LDH: 120 U/L (ref 94–250)

## 2010-10-02 ENCOUNTER — Encounter
Admission: RE | Admit: 2010-10-02 | Discharge: 2010-10-02 | Payer: Self-pay | Source: Home / Self Care | Attending: Oncology | Admitting: Oncology

## 2010-10-04 ENCOUNTER — Ambulatory Visit: Payer: Self-pay | Admitting: Oncology

## 2010-10-09 LAB — CBC WITH DIFFERENTIAL/PLATELET
BASO%: 0.3 % (ref 0.0–2.0)
Basophils Absolute: 0 10*3/uL (ref 0.0–0.1)
HCT: 46.8 % (ref 38.4–49.9)
HGB: 16.3 g/dL (ref 13.0–17.1)
LYMPH%: 20.9 % (ref 14.0–49.0)
MCHC: 34.8 g/dL (ref 32.0–36.0)
MONO#: 0.3 10*3/uL (ref 0.1–0.9)
NEUT%: 72.3 % (ref 39.0–75.0)
Platelets: 175 10*3/uL (ref 140–400)
RBC: 5.32 10*6/uL (ref 4.20–5.82)
WBC: 6.3 10*3/uL (ref 4.0–10.3)
lymph#: 1.3 10*3/uL (ref 0.9–3.3)

## 2010-10-09 LAB — COMPREHENSIVE METABOLIC PANEL
Alkaline Phosphatase: 74 U/L (ref 39–117)
BUN: 10 mg/dL (ref 6–23)
Calcium: 9.3 mg/dL (ref 8.4–10.5)
Creatinine, Ser: 0.96 mg/dL (ref 0.40–1.50)
Glucose, Bld: 103 mg/dL — ABNORMAL HIGH (ref 70–99)
Total Bilirubin: 0.6 mg/dL (ref 0.3–1.2)

## 2011-01-06 ENCOUNTER — Other Ambulatory Visit: Payer: Self-pay | Admitting: Oncology

## 2011-01-06 ENCOUNTER — Encounter (HOSPITAL_BASED_OUTPATIENT_CLINIC_OR_DEPARTMENT_OTHER): Payer: 59 | Admitting: Oncology

## 2011-01-06 DIAGNOSIS — Z5112 Encounter for antineoplastic immunotherapy: Secondary | ICD-10-CM

## 2011-01-06 DIAGNOSIS — C8590 Non-Hodgkin lymphoma, unspecified, unspecified site: Secondary | ICD-10-CM

## 2011-01-06 DIAGNOSIS — Z8546 Personal history of malignant neoplasm of prostate: Secondary | ICD-10-CM

## 2011-01-06 DIAGNOSIS — C8583 Other specified types of non-Hodgkin lymphoma, intra-abdominal lymph nodes: Secondary | ICD-10-CM

## 2011-01-06 DIAGNOSIS — Z23 Encounter for immunization: Secondary | ICD-10-CM

## 2011-01-06 LAB — COMPREHENSIVE METABOLIC PANEL
ALT: 27 U/L (ref 0–53)
Creatinine, Ser: 0.92 mg/dL (ref 0.40–1.50)
Potassium: 4 mEq/L (ref 3.5–5.3)
Sodium: 140 mEq/L (ref 135–145)
Total Bilirubin: 0.5 mg/dL (ref 0.3–1.2)

## 2011-01-06 LAB — CBC WITH DIFFERENTIAL/PLATELET
EOS%: 1.9 % (ref 0.0–7.0)
Eosinophils Absolute: 0.1 10*3/uL (ref 0.0–0.5)
HCT: 43.4 % (ref 38.4–49.9)
LYMPH%: 23.9 % (ref 14.0–49.0)
MCH: 29.8 pg (ref 27.2–33.4)
MCHC: 35.9 g/dL (ref 32.0–36.0)
MONO#: 0.4 10*3/uL (ref 0.1–0.9)
NEUT%: 68 % (ref 39.0–75.0)
Platelets: 223 10*3/uL (ref 140–400)

## 2011-01-13 ENCOUNTER — Encounter (HOSPITAL_BASED_OUTPATIENT_CLINIC_OR_DEPARTMENT_OTHER): Payer: 59 | Admitting: Oncology

## 2011-01-13 DIAGNOSIS — C8583 Other specified types of non-Hodgkin lymphoma, intra-abdominal lymph nodes: Secondary | ICD-10-CM

## 2011-01-13 DIAGNOSIS — Z5112 Encounter for antineoplastic immunotherapy: Secondary | ICD-10-CM

## 2011-01-20 ENCOUNTER — Encounter (HOSPITAL_BASED_OUTPATIENT_CLINIC_OR_DEPARTMENT_OTHER): Payer: 59 | Admitting: Oncology

## 2011-01-20 DIAGNOSIS — Z5112 Encounter for antineoplastic immunotherapy: Secondary | ICD-10-CM

## 2011-01-20 DIAGNOSIS — C8583 Other specified types of non-Hodgkin lymphoma, intra-abdominal lymph nodes: Secondary | ICD-10-CM

## 2011-01-27 ENCOUNTER — Encounter (HOSPITAL_BASED_OUTPATIENT_CLINIC_OR_DEPARTMENT_OTHER): Payer: 59 | Admitting: Oncology

## 2011-01-27 ENCOUNTER — Other Ambulatory Visit: Payer: Self-pay | Admitting: Oncology

## 2011-01-27 DIAGNOSIS — Z23 Encounter for immunization: Secondary | ICD-10-CM

## 2011-01-27 DIAGNOSIS — Z8546 Personal history of malignant neoplasm of prostate: Secondary | ICD-10-CM

## 2011-01-27 DIAGNOSIS — Z5112 Encounter for antineoplastic immunotherapy: Secondary | ICD-10-CM

## 2011-01-27 DIAGNOSIS — C8583 Other specified types of non-Hodgkin lymphoma, intra-abdominal lymph nodes: Secondary | ICD-10-CM

## 2011-01-27 LAB — CBC WITH DIFFERENTIAL/PLATELET
Basophils Absolute: 0 10*3/uL (ref 0.0–0.1)
Eosinophils Absolute: 0.1 10*3/uL (ref 0.0–0.5)
HCT: 41.7 % (ref 38.4–49.9)
HGB: 14.5 g/dL (ref 13.0–17.1)
MCHC: 34.7 g/dL (ref 32.0–36.0)
MONO#: 0.4 10*3/uL (ref 0.1–0.9)
NEUT#: 3.8 10*3/uL (ref 1.5–6.5)
NEUT%: 63.1 % (ref 39.0–75.0)
Platelets: 164 10*3/uL (ref 140–400)
RBC: 4.75 10*6/uL (ref 4.20–5.82)
RDW: 12.2 % (ref 11.0–14.6)
lymph#: 1.7 10*3/uL (ref 0.9–3.3)

## 2011-01-28 LAB — GLUCOSE, CAPILLARY: Glucose-Capillary: 103 mg/dL — ABNORMAL HIGH (ref 70–99)

## 2011-02-25 NOTE — Discharge Summary (Signed)
NAMEJOHNELL, BAS              ACCOUNT NO.:  [id number]   MEDICAL RECORD NO.:  [id number]          PATIENT TYPE:  INP   LOCATION:  5148                         FACILITY:  [facility]   PHYSICIAN:  Maisie Fus A. Cornett, M.D.DATE OF BIRTH:  August 19, 1956   DATE OF ADMISSION:  07/10/2008  DATE OF DISCHARGE:  07/12/2008                               DISCHARGE SUMMARY   PREOPERATIVE DIAGNOSIS:  Retroperitoneal lymphadenopathy.   DISCHARGE DIAGNOSIS:  Retroperitoneal lymphadenopathy.   PROCEDURE PERFORMED:  Exploratory laparotomy with biopsy of  retroperitoneal lymphadenopathy.   BRIEF HISTORY:  The patient is a 55 year old male, seen initially with  Darnell Level in the office and then sent to [facility] following week for lymph  node biopsy due to lymphadenopathy and concern for lymphoma.  He was  admitted on July 10, 2008, for this.   HOSPITAL COURSE:  Please see operative note for details of procedure.  He did well, postop with a mild ileus and was discharged by postop day  #2 to home.  His wound was clean, dry, and intact.  He is tolerating  diet without nausea and vomiting.  The tissue returned as a lymphoma.   DISCHARGE INSTRUCTIONS:  He will follow up with me in 2-4 weeks.  He  will be given Vicodin for pain.  He will refrain from any lifting,  pushing, or pulling for 2-4 weeks.  He will follow up with his  oncologist for further evaluation and treatment of his lymphoma.   CONDITION ON DISCHARGE:  Improved.   DISCHARGE MEDICATIONS:  He will go home on Vicodin 1-2 tablets q.4  p.r.n. pain.      Thomas A. Cornett, M.D.  Electronically Signed     TAC/MEDQ  D:  08/03/2008  T:  08/03/2008  Job:  [id number]

## 2011-02-25 NOTE — Op Note (Signed)
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   PERFORMED BY:  Jethro Bolus, MD.   PROCEDURE:  After obtaining informed consent, the patient was placed in  the prone position.  A timeout was performed verifying correct patient  and procedure.  The skin overlying the right posterior iliac crest was  prepped with Betadine and draped in the usual sterile fashion.  The skin  and periosteum were infiltrated with 15 mL of 2% lidocaine.  A small  puncture wound was made with a #11 scalpel blade.  A bone marrow  aspirate was obtained on the first pass of the Jamshidi needle.  Two  bone marrow core biopsies were obtained through the same incision, as  the first one was subcortical.  The aspirate was sent for routine  histology and cytogenetics.  The cores were sent for routine histology.  The patient tolerated the procedure well with minimal blood loss and  without immediate complication.  Of note, his ASA score was 1 and his  Mallampati classification was 2.  He received Versed 2.5 mg IV x1 and  Demerol 25 mg IV x1.  He was observed in the short stay without  complication, he was discharged home in stable condition.  A sterile  dressing was applied.      Jethro Bolus, MD  Electronically Signed     HH/MEDQ  D:  07/13/2008  T:  07/13/2008  Job:  [id number]

## 2011-02-25 NOTE — Op Note (Signed)
NAMEPAIGE, VANDERWOUDE              ACCOUNT NO.:  [id number]   MEDICAL RECORD NO.:  [id number]          PATIENT TYPE:  INP   LOCATION:  5118                         FACILITY:  [facility]   PHYSICIAN:  Maisie Fus A. Cornett, M.D.DATE OF BIRTH:  09/12/1956   DATE OF PROCEDURE:  07/10/2008  DATE OF DISCHARGE:                               OPERATIVE REPORT   PREOPERATIVE DIAGNOSES:  Mesenteric and retroperitoneal lymphadenopathy  and abdominal pain.   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   PROCEDURE:  1. Exploratory laparotomy.  2. Biopsy of retroperitoneal mass.   SURGEON:  Maisie Fus A. Cornett, MD   ASSISTANT:  Gabrielle Dare. Janee Morn, MD   ANESTHESIA:  General endotracheal anesthesia.   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   SPECIMEN:  Multiple fragments of large retroperitoneal mass/lymph node.   INDICATIONS FOR PROCEDURE:  The patient is a 55 year old male who was  sent to see Dr. Darnell Level last week due to significant weight loss and  abdominal distention and pain.  He underwent a core biopsy of what  appeared to be retroperitoneal lymphadenopathy by CT image, which showed  a lymphoproliferative disorder, but unfortunately none of tissue was  obtained to make a definitive diagnosis.  He contacted me to see if I  could do the biopsy on the patient this week and I agreed to do so.  I  met the patient today, discussed the procedure at length with him as  well as potential complications of bleeding, infection, organ injury,  hernia, wound breakdown, and blood clot.  He understood the above  potential complications of procedure and the necessity for it to  establish a definitive diagnosis to begin proper therapy for him.  He  agreed to proceed.   DESCRIPTION OF PROCEDURE:  The patient was brought to the operating room  and placed supine.  After induction of general  anesthesia, the abdomen  was prepped and draped in the sterile fashion.  He received preoperative  antibiotics in the appropriate time setting.  Upper midline incision was  used and dissection was carried down to enter the abdominal cavity.  There was significant chylous white fluid, which we suctioned out.  All  in all, we suctioned at least 1.5 liters of what appeared to be chylous  ascites.  The mass was easily identifiable.  He had a football-sized  retroperitoneal mass, which was very easily seen.  Multiple strips of  tissue were taken from this and sent to pathology for evaluation.  The  hemostasis was achieved with a cautery and this was excellent.  I saw no  injury to any hollow organ or solid organ.  At this point in time, count  of sponges and instruments were found to be correct.  We closed the  fascia with #1 PDS in a running fashion and the skin with staples.  A  wound VAC was  placed over the staple line given the amount of ascites with the hope  this would help seal this up quicker and hopefully  prevent any ascitic  leak.  All final counts of sponge, needle, and instruments were found to  be correct at this portion of case.  The patient was then awoke and  taken to recovery in satisfactory condition.      Thomas A. Cornett, M.D.  Electronically Signed     TAC/MEDQ  D:  07/10/2008  T:  07/11/2008  Job:  [id number]   cc:   Jethro Bolus, MD  Dr. Valentina Lucks

## 2011-02-28 NOTE — Cardiovascular Report (Signed)
NAMEDONTEE, JASO              ACCOUNT NO.:  [id number]   MEDICAL RECORD NO.:  [id number]          PATIENT TYPE:  OIB   LOCATION:  2899                         FACILITY:  [facility]   PHYSICIAN:  Corky Crafts, MDDATE OF BIRTH:  Nov 15, 1955   DATE OF PROCEDURE:  10/08/2005  DATE OF DISCHARGE:  10/08/2005                              CARDIAC CATHETERIZATION   PROCEDURES PERFORMED:  Left heart catheterization, coronary angiogram and  left ventriculogram.   OPERATOR:  Dr. Eldridge Dace.   INDICATIONS:  Abnormal stress test.   PROCEDURAL NARRATIVE:  The risks and benefits of the procedure were  explained to the patient and informed consent was obtained. The patient was  placed on the table and prepped and draped in the usual sterile fashion.  Right groin was infiltrated with 1% lidocaine. A 6-French right femoral  arterial sheath was inserted into the right femoral artery using the  modified Seldinger technique. A JL-4.0 catheter was advanced to the  ascending aorta under fluoroscopic guidance and placed in the vessel ostium.  Digital angiography was performed in multiple projections using hand  injection of contrast. A JR-4.0 catheter was then advanced and engaged the  right coronary artery. Digital angiography was performed in multiple  projections using hand injection of contrast. A pigtail catheter was then  advanced to the ascending aorta and then across the aortic valve. Power  injection of contrast was used to perform a ventriculogram. The catheter was  removed under continuous pressure monitoring. A Starclose device was  subsequently used to remove the right femoral sheath. Because the patient  will be traveling on a long plane ride, we discussed the risks and benefits  of closure devices given his travel and carrying luggage we decided that  closure would be the best choice.   FINDINGS:  The left main was a large vessel was widely patent. There is a  large ramus branch which  was angiographically normal. The circumflex is a  large vessel and gave rise to a large OM-1, distal circumflex is a small  vessel. The circumflex system is angiographically normal. The left anterior  descending is a large vessel. There did appear to be myocardial bridging in  the midportion of the vessel. A large first diagonal branch arose. There is  a small second diagonal. The left anterior descending system was  angiographically normal. The right coronary artery was a large dominant  vessel. This was also angiographically normal.   HEMODYNAMIC RESULTS:  Left ventricular pressure was 127/2 with a LVEDP of 10  mmHg. Aortic pressure was 125/76 with a mean aortic pressure of 99 mmHg.  There is no evidence of any aortic valve gradient. There was no mitral  regurgitation noted by ventriculogram.   IMPRESSION:  1.  No angiographically apparent coronary artery disease.  Mid LAD      myocardial bridge.  2.  Normal hemodynamics. No evidence of an aortic valve gradient or mitral      regurgitation.  3.  Normal left ventricular function.   RECOMMENDATIONS:  We will continue aggressive primary prevention. If the  patient requires yearly stress testing for  his job which initially prompted  this catheterization, he should have stress testing which includes cardiac  imaging since the plain treadmill test has yielded a false positive. Other  etiologies of chest pain should be  considered.  Therapy with a rate slowing agent such as a beta blocker could  also be considered based on the patient's symptoms.  This would maximize  coronary perfusion time in the setting of a myocardial bridge.  There are no  apparent complications.      Corky Crafts, MD  Electronically Signed     JSV/MEDQ  D:  10/08/2005  T:  10/08/2005  Job:  [id number]

## 2011-04-15 ENCOUNTER — Ambulatory Visit
Admission: RE | Admit: 2011-04-15 | Discharge: 2011-04-15 | Disposition: A | Discharge status: Home / Self Care | Payer: Managed Care, Other (non HMO) | Source: Ambulatory Visit | Attending: Oncology | Admitting: Oncology

## 2011-04-15 DIAGNOSIS — C8590 Non-Hodgkin lymphoma, unspecified, unspecified site: Secondary | ICD-10-CM

## 2011-04-15 MED ORDER — IOHEXOL 300 MG/ML  SOLN
100.0000 mL | Freq: Once | INTRAMUSCULAR | Status: AC | PRN
  Administered 2011-04-15: 100 mL via INTRAVENOUS

## 2011-04-18 ENCOUNTER — Other Ambulatory Visit: Payer: Self-pay | Admitting: Oncology

## 2011-04-18 ENCOUNTER — Encounter (HOSPITAL_BASED_OUTPATIENT_CLINIC_OR_DEPARTMENT_OTHER): Payer: Managed Care, Other (non HMO) | Admitting: Oncology

## 2011-04-18 DIAGNOSIS — Z23 Encounter for immunization: Secondary | ICD-10-CM

## 2011-04-18 DIAGNOSIS — Z8546 Personal history of malignant neoplasm of prostate: Secondary | ICD-10-CM

## 2011-04-18 DIAGNOSIS — C8583 Other specified types of non-Hodgkin lymphoma, intra-abdominal lymph nodes: Secondary | ICD-10-CM

## 2011-04-18 DIAGNOSIS — C8590 Non-Hodgkin lymphoma, unspecified, unspecified site: Secondary | ICD-10-CM

## 2011-04-18 DIAGNOSIS — C8589 Other specified types of non-Hodgkin lymphoma, extranodal and solid organ sites: Secondary | ICD-10-CM

## 2011-04-18 LAB — COMPREHENSIVE METABOLIC PANEL
ALT: 23 U/L (ref 0–53)
CO2: 27 mEq/L (ref 19–32)
Calcium: 9.4 mg/dL (ref 8.4–10.5)
Chloride: 107 mEq/L (ref 96–112)
Creatinine, Ser: 0.99 mg/dL (ref 0.50–1.35)
Sodium: 142 mEq/L (ref 135–145)
Total Protein: 6.3 g/dL (ref 6.0–8.3)

## 2011-04-18 LAB — LACTATE DEHYDROGENASE: LDH: 128 U/L (ref 94–250)

## 2011-04-18 LAB — CBC WITH DIFFERENTIAL/PLATELET
BASO%: 0.2 % (ref 0.0–2.0)
HCT: 45.1 % (ref 38.4–49.9)
MCHC: 34.5 g/dL (ref 32.0–36.0)
MONO#: 0.4 10*3/uL (ref 0.1–0.9)
NEUT#: 4.2 10*3/uL (ref 1.5–6.5)
NEUT%: 63.7 % (ref 39.0–75.0)
WBC: 6.6 10*3/uL (ref 4.0–10.3)
lymph#: 1.8 10*3/uL (ref 0.9–3.3)

## 2011-07-14 LAB — COMPREHENSIVE METABOLIC PANEL
ALT: 13
AST: 21
AST: 24
Albumin: 2.6 — ABNORMAL LOW
Albumin: 3.3 — ABNORMAL LOW
Alkaline Phosphatase: 58
BUN: 8
CO2: 30
Calcium: 9.6
Chloride: 102
Creatinine, Ser: 1.17
GFR calc Af Amer: >60
GFR calc Af Amer: >60
GFR calc non Af Amer: >60
Potassium: 4.3
Total Bilirubin: 0.8
Total Protein: 6.1

## 2011-07-14 LAB — CBC
HCT: 40.1
HCT: 43.4
Hemoglobin: 14.4
MCHC: 33.2
MCHC: 34.4
MCV: 84.2
MCV: 85.8
MCV: 86.6
Platelets: 288
Platelets: 335
RBC: 4.07 — ABNORMAL LOW
RDW: 12.9
RDW: 13.3
WBC: 9.7
WBC: 9.9

## 2011-07-14 LAB — DIFFERENTIAL
Basophils Absolute: 0
Eosinophils Relative: 5
Lymphocytes Relative: 13
Lymphs Abs: 1.3
Monocytes Absolute: 0.6
Neutro Abs: 7.4

## 2011-07-14 LAB — URINALYSIS, ROUTINE W REFLEX MICROSCOPIC
Glucose, UA: NEGATIVE
Hgb urine dipstick: NEGATIVE
Protein, ur: NEGATIVE
Specific Gravity, Urine: 1.018
Urobilinogen, UA: 0.2

## 2011-07-14 LAB — PROTIME-INR: Prothrombin Time: 13.6

## 2011-07-15 LAB — GLUCOSE, CAPILLARY: Glucose-Capillary: 146 — ABNORMAL HIGH

## 2011-07-15 LAB — DIFFERENTIAL
Basophils Absolute: 0
Basophils Relative: 0
Lymphocytes Relative: 9 — ABNORMAL LOW
Monocytes Absolute: 0.8
Neutro Abs: 5.8
Neutrophils Relative %: 78 — ABNORMAL HIGH

## 2011-07-15 LAB — CBC
MCHC: 33.3
Platelets: 293
RDW: 12.9

## 2011-07-15 LAB — CHROMOSOME ANALYSIS, BONE MARROW

## 2011-07-15 LAB — BONE MARROW EXAM

## 2011-10-01 ENCOUNTER — Encounter: Payer: Self-pay | Admitting: *Deleted

## 2011-10-03 ENCOUNTER — Encounter: Payer: Self-pay | Admitting: Oncology

## 2011-10-08 ENCOUNTER — Ambulatory Visit
Admission: RE | Admit: 2011-10-08 | Discharge: 2011-10-08 | Disposition: A | Discharge status: Home / Self Care | Payer: Managed Care, Other (non HMO) | Source: Ambulatory Visit | Attending: Oncology | Admitting: Oncology

## 2011-10-08 DIAGNOSIS — C8590 Non-Hodgkin lymphoma, unspecified, unspecified site: Secondary | ICD-10-CM

## 2011-10-08 MED ORDER — IOHEXOL 300 MG/ML  SOLN
125.0000 mL | Freq: Once | INTRAMUSCULAR | Status: AC | PRN
  Administered 2011-10-08: 125 mL via INTRAVENOUS

## 2011-10-09 ENCOUNTER — Ambulatory Visit (HOSPITAL_BASED_OUTPATIENT_CLINIC_OR_DEPARTMENT_OTHER): Payer: Managed Care, Other (non HMO) | Admitting: Oncology

## 2011-10-09 ENCOUNTER — Telehealth: Payer: Self-pay | Admitting: Oncology

## 2011-10-09 ENCOUNTER — Other Ambulatory Visit (HOSPITAL_BASED_OUTPATIENT_CLINIC_OR_DEPARTMENT_OTHER): Payer: Managed Care, Other (non HMO) | Admitting: Lab

## 2011-10-09 VITALS — BP 159/89 | HR 64 | Temp 98.3°F | Ht 71.0 in | Wt 220.4 lb

## 2011-10-09 DIAGNOSIS — Z8546 Personal history of malignant neoplasm of prostate: Secondary | ICD-10-CM

## 2011-10-09 DIAGNOSIS — C8590 Non-Hodgkin lymphoma, unspecified, unspecified site: Secondary | ICD-10-CM

## 2011-10-09 DIAGNOSIS — R635 Abnormal weight gain: Secondary | ICD-10-CM

## 2011-10-09 DIAGNOSIS — Z23 Encounter for immunization: Secondary | ICD-10-CM

## 2011-10-09 DIAGNOSIS — C859A Non-Hodgkin lymphoma, unspecified, in remission: Secondary | ICD-10-CM

## 2011-10-09 DIAGNOSIS — C8583 Other specified types of non-Hodgkin lymphoma, intra-abdominal lymph nodes: Secondary | ICD-10-CM

## 2011-10-09 LAB — COMPREHENSIVE METABOLIC PANEL
ALT: 63 U/L — ABNORMAL HIGH (ref 0–53)
AST: 35 U/L (ref 0–37)
Chloride: 109 mEq/L (ref 96–112)
Creatinine, Ser: 0.94 mg/dL (ref 0.50–1.35)
Sodium: 142 mEq/L (ref 135–145)
Total Bilirubin: 0.6 mg/dL (ref 0.3–1.2)

## 2011-10-09 LAB — CBC WITH DIFFERENTIAL/PLATELET
BASO%: 0.2 % (ref 0.0–2.0)
EOS%: 1.5 % (ref 0.0–7.0)
HCT: 43.5 % (ref 38.4–49.9)
MCH: 30.6 pg (ref 27.2–33.4)
MCHC: 34.9 g/dL (ref 32.0–36.0)
NEUT%: 66.4 % (ref 39.0–75.0)
RBC: 4.97 10*6/uL (ref 4.20–5.82)
lymph#: 1.6 10*3/uL (ref 0.9–3.3)

## 2011-10-09 MED ORDER — INFLUENZA VIRUS VACC SPLIT PF IM SUSP
0.5000 mL | Freq: Once | INTRAMUSCULAR | Status: AC
  Administered 2011-10-09: 0.5 mL via INTRAMUSCULAR
  Filled 2011-10-09: qty 0.5

## 2011-10-09 NOTE — Progress Notes (Signed)
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  PAST THERAPY:  Status post 6 cycles of Rituxan/CVP; last cycle given 11/01/2008.  He achieved very good partial response on follow-up CT scan and PET/CT.  He received two years of maintenance Rituxan with 4 weekly doses every 6 months. He finished in 04/2011.   CURRENT THERAPY:  Watchful observation.  INTERVAL HISTORY: Bryan Mcdowell 55 y.o. male returns for regular follow up with his wife.  He is still working [place].  He does not work out much.  He has been gaining weight.  He denies fatigue, node swelling, night sweat, mucositis, chest pain, SOB, DOE, abdominal pain, early satiety.   Patient denies fatigue, headache, visual changes, confusion, drenching night sweats, palpable lymph node swelling, mucositis, odynophagia, dysphagia, nausea vomiting, jaundice, chest pain, palpitation, shortness of breath, dyspnea on exertion, productive cough, gum bleeding, epistaxis, hematemesis, hemoptysis, abdominal pain, abdominal swelling, early satiety, melena, hematochezia, hematuria, skin rash, spontaneous bleeding, joint swelling, joint pain, heat or cold intolerance, bowel bladder incontinence, back pain, focal motor weakness, paresthesia, depression, suicidal or homocidal ideation, feeling hopelessness.  MEDICAL HISTORY: Past Medical History  Diagnosis Date  . Prostate cancer 2000  . Non Hodgkin's lymphoma 2009    marginal zone B-cell lymphoma; s/p R-CVP with CR     SURGICAL HISTORY: No past surgical history on file.  MEDICATIONS: No current outpatient prescriptions on file.    ALLERGIES:  is allergic to iodine.  REVIEW OF SYSTEMS:  The rest of the 14-point review of system was negative.   Filed Vitals:   10/09/11 0936  BP: 159/89  Pulse: 64  Temp: 98.3 F (36.8 C)   Wt Readings from Last 3 Encounters:  10/09/11 220 lb 6.4 oz  (99.973 kg)  04/18/11 210 lb 4.8 oz (95.391 kg)   ECOG Performance status: 0  PHYSICAL EXAMINATION:   General:  well-nourished in no acute distress.  Eyes:  no scleral icterus.  ENT:  There were no oropharyngeal lesions.  Neck was without thyromegaly.  Lymphatics:  Negative cervical, supraclavicular or axillary adenopathy.  Respiratory: lungs were clear bilaterally without wheezing or crackles.  Cardiovascular:  Regular rate and rhythm, S1/S2, without murmur, rub or gallop.  There was no pedal edema.  GI:  abdomen was soft, flat, nontender, nondistended, without organomegaly.  Muscoloskeletal:  no spinal tenderness of palpation of vertebral spine.  Skin exam was without echymosis, petichae.  Neuro exam was nonfocal.  Patient was able to get on and off exam table without assistance.  Gait was normal.  Patient was alerted and oriented.  Attention was good.   Language was appropriate.  Mood was normal without depression.  Speech was not pressured.  Thought content was not tangential.     LABORATORY/RADIOLOGY DATA:  Lab Results  Component Value Date   WBC 6.2 10/09/2011   HGB 15.2 10/09/2011   HCT 43.5 10/09/2011   PLT 164 10/09/2011   GLUCOSE 80 04/18/2011   ALT 23 04/18/2011   AST 19 04/18/2011   NA 142 04/18/2011   K 4.5 04/18/2011   CL 107 04/18/2011   CREATININE 0.99 04/18/2011   BUN 8 04/18/2011   CO2 27 04/18/2011   PSA 0.01* 12/05/2008   INR 1.0 07/10/2008    IMAGING:  I personally reviewed the following CT chest/abdomen/pelvis; and showed the patient and his wife the images.  There was no  sign of recurrent lymphoma.   Ct Chest W Contrast  10/08/2011  *RADIOLOGY REPORT*  Clinical Data:  Lymphoma  CT CHEST, ABDOMEN AND PELVIS WITH CONTRAST  Technique:  Multidetector CT imaging of the chest, abdomen and pelvis was performed following the standard protocol during bolus administration of intravenous contrast.  Contrast: OMNIPAQUE IOHEXOL 300 MG/ML IV SOLN  Comparison:  04/15/2011  CT CHEST   Findings:  No enlarged axillary or supraclavicular lymph nodes.  No enlarged mediastinal or hilar adenopathy identified.  No pericardial or pleural effusion identified.  The lungs are clear.  No pulmonary nodule or mass identified.  Review of the visualized osseous structures is unremarkable.  No worrisome lytic or sclerotic lesions identified.  IMPRESSION:  1.  No mass or adenopathy.  CT ABDOMEN AND PELVIS  Findings:  There is a low density structure within the left hepatic lobe measuring 5 mm, image 53.  This is unchanged when compared with the previous exam.  Gallbladder appears normal.  No biliary dilatation.  Normal appearance of the spleen.  The adrenal glands are normal.  Both kidneys are unremarkable.  Haziness / stranding in the small bowel mesentery and left periaortic region is again noted.  Previously referenced central mesentery lymph node measures 0.8 cm (image 93, series 3).  No new or enlarging mediastinal or hilar lymph nodes.  No free fluid.  The stomach and the small bowel loops are unremarkable.  The colon is negative.  Review of the visualized osseous structures is significant for mild degenerative disc disease.  IMPRESSION:  1.  Stable haziness / stranding in the small bowel mesentery and left periaortic region. 2.  No new findings.  Original Report Authenticated By: Rosealee Albee, M.D.   Ct Abdomen Pelvis W Contrast  10/08/2011  *RADIOLOGY REPORT*  Clinical Data:  Lymphoma  CT CHEST, ABDOMEN AND PELVIS WITH CONTRAST  Technique:  Multidetector CT imaging of the chest, abdomen and pelvis was performed following the standard protocol during bolus administration of intravenous contrast.  Contrast: OMNIPAQUE IOHEXOL 300 MG/ML IV SOLN  Comparison:  04/15/2011  CT CHEST  Findings:  No enlarged axillary or supraclavicular lymph nodes.  No enlarged mediastinal or hilar adenopathy identified.  No pericardial or pleural effusion identified.  The lungs are clear.  No pulmonary nodule or mass  identified.  Review of the visualized osseous structures is unremarkable.  No worrisome lytic or sclerotic lesions identified.  IMPRESSION:  1.  No mass or adenopathy.  CT ABDOMEN AND PELVIS  Findings:  There is a low density structure within the left hepatic lobe measuring 5 mm, image 53.  This is unchanged when compared with the previous exam.  Gallbladder appears normal.  No biliary dilatation.  Normal appearance of the spleen.  The adrenal glands are normal.  Both kidneys are unremarkable.  Haziness / stranding in the small bowel mesentery and left periaortic region is again noted.  Previously referenced central mesentery lymph node measures 0.8 cm (image 93, series 3).  No new or enlarging mediastinal or hilar lymph nodes.  No free fluid.  The stomach and the small bowel loops are unremarkable.  The colon is negative.  Review of the visualized osseous structures is significant for mild degenerative disc disease.  IMPRESSION:  1.  Stable haziness / stranding in the small bowel mesentery and left periaortic region. 2.  No new findings.  Original Report Authenticated By: Rosealee Albee, M.D.       ASSESSMENT AND PLAN:  1. History of non-Hodgkin's lymphoma:  I discussed with Bryan Mcdowell that there is no evidence of disease recurrence on today's clinical history, physical exam, laboratory tests, imaging modalities.  As he is more than 2 years out from the finish of induction chemo, I recommended obtaining surveillance CT every year as opposed to every 6 months.  Of course, he he has concerning symptoms, restaging CT can be done sooner.  2. Weight gain:  I advised him to have more regular work out routine as he does not have right now.  3. History of prostate cancer.  He follows with his urologist.  In March 2012, per his report, his PSA was not detectable.   4. Primary care:  His last colonoscopy was performed in December 2008, and per his report, the next one is due in December 2018.  We discussed the  pros and cons of influenza vaccination; he expressed informed understanding and wished to have one today.   5. Follow up:  Lab/RV with me in about 6 months.

## 2011-10-09 NOTE — Telephone Encounter (Signed)
lmonvm advising the pt of his July 2013 appts

## 2011-10-10 NOTE — Progress Notes (Signed)
Patient received Influenza vaccine 10/09/11, per Dr. Gaylyn Rong.

## 2012-04-11 NOTE — Patient Instructions (Signed)
1.  History of Non-Hodgkin's B-cell lymphoma:  No evidence of disease recurrence at this time. 2.  Follow up:  Continue observation.  Surveillance CT in Dec 2013 with follow up.

## 2012-04-12 ENCOUNTER — Encounter: Payer: Self-pay | Admitting: Oncology

## 2012-04-12 ENCOUNTER — Other Ambulatory Visit: Payer: Managed Care, Other (non HMO) | Admitting: Lab

## 2012-04-12 ENCOUNTER — Ambulatory Visit (HOSPITAL_BASED_OUTPATIENT_CLINIC_OR_DEPARTMENT_OTHER): Payer: Managed Care, Other (non HMO) | Admitting: Oncology

## 2012-04-12 ENCOUNTER — Telehealth: Payer: Self-pay | Admitting: Oncology

## 2012-04-12 VITALS — BP 132/84 | HR 66 | Temp 98.5°F | Ht 71.0 in | Wt 219.1 lb

## 2012-04-12 DIAGNOSIS — C8590 Non-Hodgkin lymphoma, unspecified, unspecified site: Secondary | ICD-10-CM

## 2012-04-12 DIAGNOSIS — Z8579 Personal history of other malignant neoplasms of lymphoid, hematopoietic and related tissues: Secondary | ICD-10-CM

## 2012-04-12 DIAGNOSIS — Z8572 Personal history of non-Hodgkin lymphomas: Secondary | ICD-10-CM

## 2012-04-12 DIAGNOSIS — Z8546 Personal history of malignant neoplasm of prostate: Secondary | ICD-10-CM

## 2012-04-12 DIAGNOSIS — Z87898 Personal history of other specified conditions: Secondary | ICD-10-CM

## 2012-04-12 DIAGNOSIS — R635 Abnormal weight gain: Secondary | ICD-10-CM

## 2012-04-12 HISTORY — DX: Personal history of non-Hodgkin lymphomas: Z85.72

## 2012-04-12 HISTORY — DX: Personal history of other malignant neoplasms of lymphoid, hematopoietic and related tissues: Z85.79

## 2012-04-12 LAB — CBC WITH DIFFERENTIAL/PLATELET
Basophils Absolute: 0.1 10*3/uL (ref 0.0–0.1)
EOS%: 3.7 % (ref 0.0–7.0)
HCT: 44.1 % (ref 38.4–49.9)
HGB: 15.2 g/dL (ref 13.0–17.1)
LYMPH%: 32 % (ref 14.0–49.0)
MCH: 30.1 pg (ref 27.2–33.4)
MCV: 87.5 fL (ref 79.3–98.0)
MONO%: 4.2 % (ref 0.0–14.0)
NEUT%: 59.5 % (ref 39.0–75.0)

## 2012-04-12 LAB — COMPREHENSIVE METABOLIC PANEL
AST: 19 U/L (ref 0–37)
Alkaline Phosphatase: 67 U/L (ref 39–117)
BUN: 9 mg/dL (ref 6–23)
Calcium: 9.4 mg/dL (ref 8.4–10.5)
Creatinine, Ser: 0.96 mg/dL (ref 0.50–1.35)

## 2012-04-12 NOTE — Progress Notes (Signed)
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   PAST THERAPY: Status post 6 cycles of Rituxan/CVP; last cycle given 11/01/2008. He achieved very good partial response on follow-up CT scan and PET/CT. He received two years of maintenance Rituxan with 4 weekly doses every 6 months. He finished in 04/2011.   CURRENT THERAPY: Watchful observation.  INTERVAL HISTORY: Bryan Mcdowell 56 y.o. male returns for regular follow up with his wife.  He reports doing well.  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  He said that he has been trying to swim.  However, he is still not as active as he would like to.  He has not gained weight; but he has not been able to lose weight.   Patient denies fever, anorexia, weight loss, fatigue, headache, visual changes, confusion, drenching night sweats, palpable lymph node swelling, mucositis, odynophagia, dysphagia, nausea vomiting, jaundice, chest pain, palpitation, shortness of breath, dyspnea on exertion, productive cough, gum bleeding, epistaxis, hematemesis, hemoptysis, abdominal pain, abdominal swelling, early satiety, melena, hematochezia, hematuria, skin rash, spontaneous bleeding, joint swelling, joint pain, heat or cold intolerance, bowel bladder incontinence, back pain, focal motor weakness, paresthesia, depression, suicidal or homocidal ideation, feeling hopelessness.   Past Medical History  Diagnosis Date  . Prostate cancer 2000  . Non Hodgkin's lymphoma 2009    marginal zone B-cell lymphoma; s/p R-CVP with CR   . History of lymphoma 04/12/2012    No past surgical history on file.  Current Outpatient Prescriptions  Medication Sig Dispense Refill  . aspirin 81 MG tablet Take 81 mg by mouth daily.        ALLERGIES:  is allergic to iodine.  REVIEW OF SYSTEMS:  The rest of the  14-point review of system was negative.   Filed Vitals:   04/12/12 1433  BP: 132/84  Pulse: 66  Temp: 98.5 F (36.9 C)   Wt Readings from Last 3 Encounters:  04/12/12 219 lb 1.6 oz (99.383 kg)  10/09/11 220 lb 6.4 oz (99.973 kg)  04/18/11 210 lb 4.8 oz (95.391 kg)   ECOG Performance status: 0  PHYSICAL EXAMINATION:   General: well-nourished in no acute distress. Eyes: no scleral icterus. ENT: There were no oropharyngeal lesions. Neck was without thyromegaly. Lymphatics: Negative cervical, supraclavicular or axillary adenopathy. Respiratory: lungs were clear bilaterally without wheezing or crackles. Cardiovascular: Regular rate and rhythm, S1/S2, without murmur, rub or gallop. There was no pedal edema. GI: abdomen was soft, flat, nontender, nondistended, without organomegaly. Muscoloskeletal: no spinal tenderness of palpation of vertebral spine. Skin exam was without echymosis, petichae. Neuro exam was nonfocal. Patient was able to get on and off exam table without assistance. Gait was normal. Patient was alerted and oriented. Attention was good. Language was appropriate. Mood was normal without depression. Speech was not pressured. Thought content was not tangential.    LABORATORY/RADIOLOGY DATA:  Lab Results  Component Value Date   WBC 8.9 04/12/2012   HGB 15.2 04/12/2012   HCT 44.1 04/12/2012   PLT 182 04/12/2012   GLUCOSE 107* 04/12/2012   ALKPHOS 67 04/12/2012   ALT 26 04/12/2012   AST 19 04/12/2012   NA 143 04/12/2012   K 4.1 04/12/2012   CL 106 04/12/2012   CREATININE 0.96 04/12/2012   BUN 9 04/12/2012   CO2 27 04/12/2012   PSA  0.01* 12/05/2008   INR 1.0 07/10/2008      ASSESSMENT AND PLAN:   1. History of non-Hodgkin's lymphoma: I discussed with Bryan Mcdowell that there is no evidence of disease recurrence on today's clinical history, physical exam, laboratory tests.  I again recommended observation with yearly CT.  Next one is due on 09/2012.  I ordered these today.  We discussed briefly the  potential use of BKT inhibitor (such as Ibrutinib) or CAL 101 inhibitor which have been having encouraging data (albeit not yet approved) if his lymphoma were to recur in the future.  He was very encouraged by this news.  2. Weight gain: I advised him to have more regular work out routine as he does not have right now.  3. History of prostate cancer. He follows with his urologist. In 03/2012, his PSA with his Urologist was still normal.  4. Primary care: His last colonoscopy was performed in December 2008, and per his report, the next one is due in December 2018.  5. Follow up: CT and Lab/RV with me in about 6 months.

## 2012-04-12 NOTE — Telephone Encounter (Signed)
appts made and printed for pt aom °

## 2012-09-30 ENCOUNTER — Ambulatory Visit
Admission: RE | Admit: 2012-09-30 | Discharge: 2012-09-30 | Disposition: A | Discharge status: Home / Self Care | Payer: Managed Care, Other (non HMO) | Source: Ambulatory Visit | Attending: Oncology | Admitting: Oncology

## 2012-09-30 DIAGNOSIS — Z8579 Personal history of other malignant neoplasms of lymphoid, hematopoietic and related tissues: Secondary | ICD-10-CM

## 2012-09-30 MED ORDER — IOHEXOL 300 MG/ML  SOLN
125.0000 mL | Freq: Once | INTRAMUSCULAR | Status: AC | PRN
  Administered 2012-09-30: 125 mL via INTRAVENOUS

## 2012-10-01 ENCOUNTER — Encounter: Payer: Self-pay | Admitting: Oncology

## 2012-10-01 ENCOUNTER — Ambulatory Visit (HOSPITAL_BASED_OUTPATIENT_CLINIC_OR_DEPARTMENT_OTHER): Payer: Managed Care, Other (non HMO) | Admitting: Lab

## 2012-10-01 ENCOUNTER — Telehealth: Payer: Self-pay | Admitting: *Deleted

## 2012-10-01 ENCOUNTER — Telehealth: Payer: Self-pay | Admitting: Oncology

## 2012-10-01 ENCOUNTER — Ambulatory Visit (HOSPITAL_BASED_OUTPATIENT_CLINIC_OR_DEPARTMENT_OTHER): Payer: Managed Care, Other (non HMO) | Admitting: Oncology

## 2012-10-01 VITALS — BP 145/86 | HR 63 | Temp 97.4°F | Resp 18 | Ht 71.0 in | Wt 217.9 lb

## 2012-10-01 DIAGNOSIS — C8583 Other specified types of non-Hodgkin lymphoma, intra-abdominal lymph nodes: Secondary | ICD-10-CM

## 2012-10-01 DIAGNOSIS — Z8579 Personal history of other malignant neoplasms of lymphoid, hematopoietic and related tissues: Secondary | ICD-10-CM

## 2012-10-01 DIAGNOSIS — R635 Abnormal weight gain: Secondary | ICD-10-CM

## 2012-10-01 DIAGNOSIS — Z8546 Personal history of malignant neoplasm of prostate: Secondary | ICD-10-CM

## 2012-10-01 LAB — CBC WITH DIFFERENTIAL/PLATELET
BASO%: 0.5 % (ref 0.0–2.0)
LYMPH%: 30.7 % (ref 14.0–49.0)
MCHC: 34.5 g/dL (ref 32.0–36.0)
MONO#: 0.5 10*3/uL (ref 0.1–0.9)
NEUT#: 5.1 10*3/uL (ref 1.5–6.5)
RBC: 5.21 10*6/uL (ref 4.20–5.82)
RDW: 12.8 % (ref 11.0–14.6)
WBC: 8.3 10*3/uL (ref 4.0–10.3)
lymph#: 2.6 10*3/uL (ref 0.9–3.3)

## 2012-10-01 LAB — COMPREHENSIVE METABOLIC PANEL (CC13)
ALT: 58 U/L — ABNORMAL HIGH (ref 0–55)
Albumin: 4 g/dL (ref 3.5–5.0)
CO2: 27 mEq/L (ref 22–29)
Chloride: 106 mEq/L (ref 98–107)
Potassium: 4 mEq/L (ref 3.5–5.1)
Sodium: 142 mEq/L (ref 136–145)
Total Bilirubin: 0.58 mg/dL (ref 0.20–1.20)
Total Protein: 6.7 g/dL (ref 6.4–8.3)

## 2012-10-01 LAB — LACTATE DEHYDROGENASE (CC13): LDH: 160 U/L (ref 125–245)

## 2012-10-01 MED ORDER — OMEPRAZOLE 20 MG PO CPDR: 20.0000 mg | DELAYED_RELEASE_CAPSULE | Freq: Every day | ORAL | Status: DC

## 2012-10-01 NOTE — Telephone Encounter (Signed)
Message copied by Wende Mott on Fri Oct 01, 2012  4:27 PM ------      Message from: Myrtis Ser      Created: Fri Oct 01, 2012  1:17 PM       Please call pt. Labs look good - no evidence of lymphoma recurrence. The only value that is abnormal is the ALT is 58 (normal is 0-55). I suspect this is due to his fatty liver disease. Recommend diet, exercise, weight loss. Will check this again in 6 months.

## 2012-10-01 NOTE — Progress Notes (Signed)
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   PAST THERAPY: Status post 6 cycles of Rituxan/CVP; last cycle given 11/01/2008. He achieved very good partial response on follow-up CT scan and PET/CT. He received two years of maintenance Rituxan with 4 weekly doses every 6 months. He finished in 04/2011.   CURRENT THERAPY: Watchful observation.  INTERVAL HISTORY: Bryan Mcdowell 56 y.o. male returns for regular follow up with his wife.  He reports doing well.  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  He said that he has been trying to swim.  However, he is still not as active as he would like to.  He has not gained weight; but he has not been able to lose weight. Has reflux symptoms and is taking Pepcid complete nightly.  Patient denies fever, anorexia, weight loss, fatigue, headache, visual changes, confusion, drenching night sweats, palpable lymph node swelling, mucositis, odynophagia, dysphagia, nausea vomiting, jaundice, chest pain, palpitation, shortness of breath, dyspnea on exertion, productive cough, gum bleeding, epistaxis, hematemesis, hemoptysis, abdominal pain, abdominal swelling, early satiety, melena, hematochezia, hematuria, skin rash, spontaneous bleeding, joint swelling, joint pain, heat or cold intolerance, bowel bladder incontinence, back pain, focal motor weakness, paresthesia, depression, suicidal or homocidal ideation, feeling hopelessness.   Past Medical History  Diagnosis Date  . Prostate cancer 2000  . Non Hodgkin's lymphoma 2009    marginal zone B-cell lymphoma; s/p R-CVP with CR   . History of lymphoma 04/12/2012    History reviewed. No pertinent past surgical history.  Current Outpatient Prescriptions  Medication Sig Dispense Refill  . [DISCONTINUED] famotidine (PEPCID) 20 MG tablet Take 20 mg  by mouth at bedtime.      Marland Kitchen aspirin 81 MG tablet Take 81 mg by mouth daily.      Marland Kitchen omeprazole (PRILOSEC) 20 MG capsule Take 1 capsule (20 mg total) by mouth daily.  30 capsule  2    ALLERGIES:  is allergic to iodine.  REVIEW OF SYSTEMS:  The rest of the 14-point review of system was negative.   Filed Vitals:   10/01/12 1031  BP: 145/86  Pulse: 63  Temp: 97.4 F (36.3 C)  Resp: 18   Wt Readings from Last 3 Encounters:  10/01/12 217 lb 14.4 oz (98.839 kg)  04/12/12 219 lb 1.6 oz (99.383 kg)  10/09/11 220 lb 6.4 oz (99.973 kg)   ECOG Performance status: 0  PHYSICAL EXAMINATION:   General: well-nourished in no acute distress. Eyes: no scleral icterus. ENT: There were no oropharyngeal lesions. Neck was without thyromegaly. Lymphatics: Negative cervical, supraclavicular or axillary adenopathy. Respiratory: lungs were clear bilaterally without wheezing or crackles. Cardiovascular: Regular rate and rhythm, S1/S2, without murmur, rub or gallop. There was no pedal edema. GI: abdomen was soft, flat, nontender, nondistended, without organomegaly. Muscoloskeletal: no spinal tenderness of palpation of vertebral spine. Skin exam was without echymosis, petichae. Neuro exam was nonfocal. Patient was able to get on and off exam table without assistance. Gait was normal. Patient was alerted and oriented. Attention was good. Language was appropriate. Mood was normal without depression. Speech was not pressured. Thought content was not tangential.    LABORATORY/RADIOLOGY DATA:  Lab Results  Component Value Date   WBC 8.3 10/01/2012   HGB 15.8 10/01/2012   HCT 45.7 10/01/2012   PLT 179 10/01/2012  GLUCOSE 89 10/01/2012   ALKPHOS 87 10/01/2012   ALT 58* 10/01/2012   AST 26 10/01/2012   NA 142 10/01/2012   K 4.0 10/01/2012   CL 106 10/01/2012   CREATININE 1.0 10/01/2012   BUN 8.0 10/01/2012   CO2 27 10/01/2012   PSA 0.01* 12/05/2008   INR 1.0 07/10/2008   RADIOLOGY:  *RADIOLOGY REPORT*   Clinical Data: History of lymphoma and prostate cancer, prior  chemotherapy and surgery, follow-up  CT CHEST, ABDOMEN AND PELVIS WITH CONTRAST  Technique: Multidetector CT imaging of the chest, abdomen and  pelvis was performed following the standard protocol during bolus  administration of intravenous contrast. Sagittal and coronal MPR  images reconstructed from axial data set.  Contrast: OMNIPAQUE IOHEXOL 300 MG/ML SOLN Dilute oral  contrast.  Comparison: 10/08/2011  CT CHEST  Findings:  Thoracic vascular structures patent on non dedicated exam.  No mediastinal, hilar, or axillary adenopathy.  Lungs clear.  No pleural effusion, pneumothorax, or acute osseous findings.  IMPRESSION:  No evidence of recurrent disease.  CT ABDOMEN AND PELVIS  Findings:  Diffuse fatty infiltration of liver.  7 mm hypoechoic focus lateral segment left lobe liver image 55  unchanged since 10/02/2010; this appears slightly smaller on the  10/08/2011 exam, likely related to volume averaging artifact.  Liver, spleen, pancreas, kidneys, and adrenal glands normal.  Again identified stranding throughout the small bowel mesentery  with scattered associated normal-sized mesenteric nodes, question  sequela of prior lymphoma treatment, with bulky mesenteric  adenopathy identified on the 07/17/2008 exam.  Appendix not visualized.  Circumaortic left renal vein incidentally noted.  Stomach and bowel loops normal appearance.  No mass, adenopathy, free fluid or inflammatory process.  Prior prostatectomy and question vasectomy with normal-appearing  bladder.  Question left inguinal hernia containing fat.  No acute bony lesion.  IMPRESSION:  Stable mesenteric stranding with associated normal-sized mesenteric  nodes, likely sequela of prior treated lymphadenopathy.  Fatty infiltration of liver.  Stable 7 mm low attenuation lesion left lobe liver.  Question small left inguinal hernia containing fat.  No  evidence of recurrent abdominal or pelvic disease.  Original Report Authenticated By: Ulyses Southward, M.D.   ASSESSMENT AND PLAN:   1. History of non-Hodgkin's lymphoma: I discussed with Mr. Mak that there is no evidence of disease recurrence on today's clinical history, physical exam, laboratory tests, and CT scan.  I again recommended observation with yearly CT.  Next one is due on 09/2013.   2. Weight gain: I advised him to have more regular work out routine as he does not have right now.  3. History of prostate cancer. He follows with his urologist. In 03/2012, his PSA with his Urologist was still normal.  4. Primary care: His last colonoscopy was performed in December 2008, and per his report, the next one is due in December 2018.  5. Follow up: Lab and RV in 6 months.

## 2012-10-01 NOTE — Telephone Encounter (Signed)
Gave pt appt for lab and MD on June 2014, pt sent to labs today

## 2012-10-01 NOTE — Telephone Encounter (Signed)
Left VM for pt to return call regarding his lab work.   Wife returned call and I informed her of Dr. Lodema Pilot message below.  She verbalized understanding.

## 2012-10-01 NOTE — Patient Instructions (Signed)
CT CHEST, ABDOMEN AND PELVIS WITH CONTRAST  Technique: Multidetector CT imaging of the chest, abdomen and  pelvis was performed following the standard protocol during bolus  administration of intravenous contrast. Sagittal and coronal MPR  images reconstructed from axial data set.  Contrast: OMNIPAQUE IOHEXOL 300 MG/ML SOLN Dilute oral  contrast.  Comparison: 10/08/2011  CT CHEST  Findings:  Thoracic vascular structures patent on non dedicated exam.  No mediastinal, hilar, or axillary adenopathy.  Lungs clear.  No pleural effusion, pneumothorax, or acute osseous findings.  IMPRESSION:  No evidence of recurrent disease.  CT ABDOMEN AND PELVIS  Findings:  Diffuse fatty infiltration of liver.  7 mm hypoechoic focus lateral segment left lobe liver image 55  unchanged since 10/02/2010; this appears slightly smaller on the  10/08/2011 exam, likely related to volume averaging artifact.  Liver, spleen, pancreas, kidneys, and adrenal glands normal.  Again identified stranding throughout the small bowel mesentery  with scattered associated normal-sized mesenteric nodes, question  sequela of prior lymphoma treatment, with bulky mesenteric  adenopathy identified on the 07/17/2008 exam.  Appendix not visualized.  Circumaortic left renal vein incidentally noted.  Stomach and bowel loops normal appearance.  No mass, adenopathy, free fluid or inflammatory process.  Prior prostatectomy and question vasectomy with normal-appearing  bladder.  Question left inguinal hernia containing fat.  No acute bony lesion.  IMPRESSION:  Stable mesenteric stranding with associated normal-sized mesenteric  nodes, likely sequela of prior treated lymphadenopathy.  Fatty infiltration of liver.  Stable 7 mm low attenuation lesion left lobe liver.  Question small left inguinal hernia containing fat.  No evidence of recurrent abdominal or pelvic disease.

## 2013-04-01 ENCOUNTER — Ambulatory Visit (HOSPITAL_BASED_OUTPATIENT_CLINIC_OR_DEPARTMENT_OTHER): Payer: Managed Care, Other (non HMO) | Admitting: Oncology

## 2013-04-01 ENCOUNTER — Other Ambulatory Visit (HOSPITAL_BASED_OUTPATIENT_CLINIC_OR_DEPARTMENT_OTHER): Payer: Managed Care, Other (non HMO) | Admitting: Lab

## 2013-04-01 ENCOUNTER — Telehealth: Payer: Self-pay | Admitting: Oncology

## 2013-04-01 VITALS — BP 141/84 | HR 63 | Temp 98.1°F | Resp 18 | Ht 71.0 in | Wt 219.5 lb

## 2013-04-01 DIAGNOSIS — Z8579 Personal history of other malignant neoplasms of lymphoid, hematopoietic and related tissues: Secondary | ICD-10-CM

## 2013-04-01 DIAGNOSIS — Z87898 Personal history of other specified conditions: Secondary | ICD-10-CM

## 2013-04-01 LAB — CBC WITH DIFFERENTIAL/PLATELET
BASO%: 0.9 % (ref 0.0–2.0)
EOS%: 1.2 % (ref 0.0–7.0)
HCT: 43.2 % (ref 38.4–49.9)
LYMPH%: 36.9 % (ref 14.0–49.0)
MCH: 30 pg (ref 27.2–33.4)
MCHC: 35.4 g/dL (ref 32.0–36.0)
NEUT%: 54.6 % (ref 39.0–75.0)
RBC: 5.1 10*6/uL (ref 4.20–5.82)
lymph#: 2.9 10*3/uL (ref 0.9–3.3)

## 2013-04-01 LAB — COMPREHENSIVE METABOLIC PANEL (CC13)
ALT: 40 U/L (ref 0–55)
AST: 20 U/L (ref 5–34)
Chloride: 109 mEq/L — ABNORMAL HIGH (ref 98–107)
Creatinine: 1 mg/dL (ref 0.7–1.3)
Total Bilirubin: 0.68 mg/dL (ref 0.20–1.20)

## 2013-04-01 NOTE — Progress Notes (Signed)
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   PAST THERAPY: Status post 6 cycles of Rituxan/CVP; last cycle given 11/01/2008. He achieved very good partial response on follow-up CT scan and PET/CT. He received two years of maintenance Rituxan with 4 weekly doses every 6 months. He finished in 04/2011.   CURRENT THERAPY: Watchful observation.  INTERVAL HISTORY: [facility] 57 y.o. male returns for regular follow up with his wife.  He feels well.  He does not exercise as much as he did before.  He is gaining weight. 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  His wife recently was diagnosed with breast cancer.   Patient denies fever, anorexia, weight loss, fatigue, headache, visual changes, confusion, drenching night sweats, palpable lymph node swelling, mucositis, odynophagia, dysphagia, nausea vomiting, jaundice, chest pain, palpitation, shortness of breath, dyspnea on exertion, productive cough, gum bleeding, epistaxis, hematemesis, hemoptysis, abdominal pain, abdominal swelling, early satiety, melena, hematochezia, hematuria, skin rash, spontaneous bleeding, joint swelling, joint pain, heat or cold intolerance, bowel bladder incontinence, back pain, focal motor weakness, paresthesia, depression.     Past Medical History  Diagnosis Date  . Prostate cancer 2000  . Non Hodgkin's lymphoma 2009    marginal zone B-cell lymphoma; s/p R-CVP with CR   . History of lymphoma 04/12/2012    No past surgical history on file.  Current Outpatient Prescriptions  Medication Sig Dispense Refill  . aspirin 81 MG tablet Take 81 mg by mouth daily.      Marland Kitchen omeprazole (PRILOSEC) 20 MG capsule Take 1 capsule (20 mg total) by mouth daily.  30 capsule  2  . [DISCONTINUED] famotidine (PEPCID) 20 MG tablet Take 20 mg by mouth at  bedtime.       No current facility-administered medications for this visit.    ALLERGIES:  is allergic to iodine.  REVIEW OF SYSTEMS:  The rest of the 14-point review of system was negative.   Filed Vitals:   04/01/13 0926  BP: 141/84  Pulse: 63  Temp: 98.1 F (36.7 C)  Resp: 18   Wt Readings from Last 3 Encounters:  04/01/13 219 lb 8 oz (99.565 kg)  10/01/12 217 lb 14.4 oz (98.839 kg)  04/12/12 219 lb 1.6 oz (99.383 kg)   ECOG Performance status: 0  PHYSICAL EXAMINATION:   General: well-nourished man, in no acute distress. Eyes: no scleral icterus. ENT: There were no oropharyngeal lesions. Neck was without thyromegaly. Lymphatics: Negative cervical, supraclavicular or axillary adenopathy. Respiratory: lungs were clear bilaterally without wheezing or crackles. Cardiovascular: Regular rate and rhythm, S1/S2, without murmur, rub or gallop. There was no pedal edema. GI: abdomen was soft, flat, nontender, nondistended, without organomegaly. Muscoloskeletal: no spinal tenderness of palpation of vertebral spine. Skin exam was without echymosis, petichae. Neuro exam was nonfocal. Patient was able to get on and off exam table without assistance. Gait was normal. Patient was alert and oriented. Attention was good. Language was appropriate. Mood was normal without depression. Speech was not pressured. Thought content was not tangential.    LABORATORY/RADIOLOGY DATA:  Lab Results  Component Value Date   WBC 7.8 04/01/2013   HGB 15.3 04/01/2013   HCT 43.2 04/01/2013   PLT 191 04/01/2013   GLUCOSE 103* 04/01/2013   ALKPHOS 74 04/01/2013   ALT 40 04/01/2013   AST  20 04/01/2013   NA 141 04/01/2013   K 3.9 04/01/2013   CL 109* 04/01/2013   CREATININE 1.0 04/01/2013   BUN 11.7 04/01/2013   CO2 23 04/01/2013   PSA 0.01* 12/05/2008   INR 1.0 07/10/2008      ASSESSMENT AND PLAN:   1. History of non-Hodgkin's lymphoma: continue to be in remission.  As he is more than 3 years out from the treatment,  and he had indolent NHL; and now asymptomatic, there is no indication for routine CT scan.  He agreed with this for now and will let us now if he has concerning symptoms.   2. Follow up:  Return visit in about 6 months.    I informed Ms. Hehl that I am leaving the practice.  [facility] will arrange for him to see another provider when he returns.   The length of time of the face-to-face encounter was 10 minutes. More than 50% of time was spent counseling and coordination of care.    Pyper Olexa T. Gaylyn Rong, M.D.

## 2013-04-01 NOTE — Telephone Encounter (Signed)
Gave pt appt for lab, md December 2014

## 2013-08-18 ENCOUNTER — Other Ambulatory Visit: Payer: Self-pay

## 2013-09-29 ENCOUNTER — Other Ambulatory Visit: Payer: Self-pay | Admitting: Hematology and Oncology

## 2013-09-29 DIAGNOSIS — Z8579 Personal history of other malignant neoplasms of lymphoid, hematopoietic and related tissues: Secondary | ICD-10-CM

## 2013-09-30 ENCOUNTER — Telehealth: Payer: Self-pay | Admitting: Internal Medicine

## 2013-09-30 ENCOUNTER — Ambulatory Visit (HOSPITAL_BASED_OUTPATIENT_CLINIC_OR_DEPARTMENT_OTHER): Payer: Managed Care, Other (non HMO) | Admitting: Internal Medicine

## 2013-09-30 ENCOUNTER — Encounter: Payer: Self-pay | Admitting: Internal Medicine

## 2013-09-30 ENCOUNTER — Other Ambulatory Visit (HOSPITAL_BASED_OUTPATIENT_CLINIC_OR_DEPARTMENT_OTHER): Payer: Managed Care, Other (non HMO)

## 2013-09-30 VITALS — BP 163/95 | HR 62 | Temp 97.4°F | Resp 18 | Ht 71.0 in | Wt 223.8 lb

## 2013-09-30 DIAGNOSIS — R030 Elevated blood-pressure reading, without diagnosis of hypertension: Secondary | ICD-10-CM

## 2013-09-30 DIAGNOSIS — Z8579 Personal history of other malignant neoplasms of lymphoid, hematopoietic and related tissues: Secondary | ICD-10-CM

## 2013-09-30 DIAGNOSIS — Z87898 Personal history of other specified conditions: Secondary | ICD-10-CM

## 2013-09-30 DIAGNOSIS — C61 Malignant neoplasm of prostate: Secondary | ICD-10-CM | POA: Insufficient documentation

## 2013-09-30 DIAGNOSIS — K760 Fatty (change of) liver, not elsewhere classified: Secondary | ICD-10-CM | POA: Insufficient documentation

## 2013-09-30 DIAGNOSIS — K7689 Other specified diseases of liver: Secondary | ICD-10-CM

## 2013-09-30 DIAGNOSIS — E669 Obesity, unspecified: Secondary | ICD-10-CM

## 2013-09-30 LAB — CBC WITH DIFFERENTIAL/PLATELET
Basophils Absolute: 0.1 10*3/uL (ref 0.0–0.1)
EOS%: 1.2 % (ref 0.0–7.0)
HCT: 47.3 % (ref 38.4–49.9)
HGB: 16.3 g/dL (ref 13.0–17.1)
MCH: 30.3 pg (ref 27.2–33.4)
MCV: 87.8 fL (ref 79.3–98.0)
MONO%: 4.8 % (ref 0.0–14.0)
NEUT%: 59.5 % (ref 39.0–75.0)

## 2013-09-30 LAB — COMPREHENSIVE METABOLIC PANEL (CC13)
AST: 28 U/L (ref 5–34)
Alkaline Phosphatase: 78 U/L (ref 40–150)
BUN: 12 mg/dL (ref 7.0–26.0)
Calcium: 9.6 mg/dL (ref 8.4–10.4)
Chloride: 108 mEq/L (ref 98–109)
Creatinine: 1 mg/dL (ref 0.7–1.3)

## 2013-09-30 NOTE — Telephone Encounter (Signed)
appts made per 12/19 POF AVS and CAL given shh

## 2013-09-30 NOTE — Progress Notes (Signed)
[facility] OFFICE PROGRESS NOTE  Lillia Mountain, MD [address], [address] [place] [address]  DIAGNOSIS: History of lymphoma - Plan: CBC with Differential, Comprehensive metabolic panel (Cmet) - [facility], Lactate dehydrogenase (LDH) - [facility]  Prostate cancer  Chief Complaint  Patient presents with  . History of lymphoma   PAST THERAPY: Status post 6 cycles of Rituxan/CVP; last cycle given 11/01/2008. He achieved very good partial response on follow-up CT scan and PET/CT. He received two years of maintenance Rituxan with 4 weekly doses every 6 months. He finished in 04/2011.   CURRENT THERAPY: Watchful observation.   INTERVAL HISTORY:  Bryan Mcdowell 57 y.o. male returns for regular follow up. He was last seen by Dr. Gaylyn Rong on 04/01/2013.  He is also being followed by Dr. Christell Constant at [facility].  He has also seen Dr. Roselyn Bering for a diagnosis of fatty liver disease.  He denies any recent hospitalization or emergency room visits.  He reports noticing an increase in his blood pressure measurements.  He also notes that he has daytime somnolence and his wife at times complain of his loud snoring.  He states despite this, he is able to function at work during the day.  Overall, he feels well. He does not exercise as much as he did before. He gain 5 lbs.  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 His wife recently was diagnosed with breast cancer.   Patient denies fever, anorexia, weight loss, fatigue, headache, visual changes, confusion, drenching night sweats, palpable lymph node swelling, mucositis, odynophagia, dysphagia, nausea vomiting, jaundice, chest pain, palpitation, shortness of breath, dyspnea on exertion, productive cough, gum bleeding, epistaxis, hematemesis, hemoptysis, abdominal pain, abdominal swelling, early satiety, melena, hematochezia, hematuria, skin rash, spontaneous bleeding, joint swelling, joint pain, heat or cold intolerance, bowel  bladder incontinence, back pain, focal motor weakness, paresthesia, depression.   MEDICAL HISTORY: Past Medical History  Diagnosis Date  . Prostate cancer 2000  . Non Hodgkin's lymphoma 2009    marginal zone B-cell lymphoma; s/p R-CVP with CR   . History of lymphoma 04/12/2012    INTERIM HISTORY: has History of lymphoma and Prostate cancer on his problem list.    ALLERGIES:  is allergic to iodine.  MEDICATIONS: has a current medication list which includes the following prescription(s): aspirin and omeprazole.  SURGICAL HISTORY: No past surgical history on file.  REVIEW OF SYSTEMS:   Constitutional: Denies fevers, chills or abnormal weight loss Eyes: Denies blurriness of vision Ears, nose, mouth, throat, and face: Denies mucositis or sore throat Respiratory: Denies cough, dyspnea or wheezes Cardiovascular: Denies palpitation, chest discomfort or lower extremity swelling Gastrointestinal:  Denies nausea, heartburn or change in bowel habits Skin: Denies abnormal skin rashes Lymphatics: Denies new lymphadenopathy or easy bruising Neurological:Denies numbness, tingling or new weaknesses Behavioral/Psych: Mood is stable, no new changes  All other systems were reviewed with the patient and are negative.  PHYSICAL EXAMINATION: ECOG PERFORMANCE STATUS: 0 - Asymptomatic  Blood pressure 163/95, pulse 62, temperature 97.4 F (36.3 C), temperature source Oral, resp. rate 18, height 5\' 11"  (1.803 m), weight 223 lb 12.8 oz (101.515 kg).  GENERAL:alert, no distress and comfortable, well developed and well nourished SKIN: skin color, texture, turgor are normal, no rashes or significant lesions EYES: normal, Conjunctiva are pink and non-injected, sclera clear OROPHARYNX:no exudate, no erythema and lips, buccal mucosa, and tongue normal  NECK: supple, thyroid normal size, non-tender, without nodularity LYMPH:  no palpable  lymphadenopathy in the cervical, axillary or supraclavicular LUNGS:  clear to auscultation and percussion with normal breathing effort HEART: regular rate & rhythm and no murmurs and no lower extremity edema ABDOMEN:abdomen soft, non-tender and normal bowel sounds; Abdominal scar well-healed.  Musculoskeletal:no cyanosis of digits and no clubbing  NEURO: alert & oriented x 3 with fluent speech, no focal motor/sensory deficits  LABORATORY DATA: Results for orders placed in visit on 09/30/13 (from the past 48 hour(s))  CBC WITH DIFFERENTIAL     Status: None   Collection Time    09/30/13  9:29 AM      Result Value Range   WBC 7.8  4.0 - 10.3 10e3/uL   NEUT# 4.6  1.5 - 6.5 10e3/uL   HGB 16.3  13.0 - 17.1 g/dL   HCT 16.1  09.6 - 04.5 %   Platelets 202  140 - 400 10e3/uL   MCV 87.8  79.3 - 98.0 fL   MCH 30.3  27.2 - 33.4 pg   MCHC 34.5  32.0 - 36.0 g/dL   RBC 4.09  8.11 - 9.14 10e6/uL   RDW 13.0  11.0 - 14.6 %   lymph# 2.6  0.9 - 3.3 10e3/uL   MONO# 0.4  0.1 - 0.9 10e3/uL   Eosinophils Absolute 0.1  0.0 - 0.5 10e3/uL   Basophils Absolute 0.1  0.0 - 0.1 10e3/uL   NEUT% 59.5  39.0 - 75.0 %   LYMPH% 33.8  14.0 - 49.0 %   MONO% 4.8  0.0 - 14.0 %   EOS% 1.2  0.0 - 7.0 %   BASO% 0.7  0.0 - 2.0 %  LACTATE DEHYDROGENASE (CC13)     Status: None   Collection Time    09/30/13  9:29 AM      Result Value Range   LDH 161  125 - 245 U/L  COMPREHENSIVE METABOLIC PANEL (CC13)     Status: None   Collection Time    09/30/13  9:29 AM      Result Value Range   Sodium 142  136 - 145 mEq/L   Potassium 4.3  3.5 - 5.1 mEq/L   Chloride 108  98 - 109 mEq/L   CO2 24  22 - 29 mEq/L   Glucose 102  70 - 140 mg/dl   BUN 78.2  7.0 - 95.6 mg/dL   Creatinine 1.0  0.7 - 1.3 mg/dL   Total Bilirubin 2.13  0.20 - 1.20 mg/dL   Alkaline Phosphatase 78  40 - 150 U/L   AST 28  5 - 34 U/L   ALT 48  0 - 55 U/L   Total Protein 7.1  6.4 - 8.3 g/dL   Albumin 4.3  3.5 - 5.0 g/dL   Calcium 9.6  8.4 - 08.6 mg/dL   Anion Gap 9  3 - 11 mEq/L    Labs:  Lab Results  Component Value Date    WBC 7.8 09/30/2013   HGB 16.3 09/30/2013   HCT 47.3 09/30/2013   MCV 87.8 09/30/2013   PLT 202 09/30/2013   NEUTROABS 4.6 09/30/2013      Chemistry      Component Value Date/Time   NA 142 09/30/2013 0929   NA 143 04/12/2012 1406   K 4.3 09/30/2013 0929   K 4.1 04/12/2012 1406   CL 109* 04/01/2013 0905   CL 106 04/12/2012 1406   CO2 24 09/30/2013 0929   CO2 27 04/12/2012 1406   BUN 12.0 09/30/2013 0929  BUN 9 04/12/2012 1406   CREATININE 1.0 09/30/2013 0929   CREATININE 0.96 04/12/2012 1406      Component Value Date/Time   CALCIUM 9.6 09/30/2013 0929   CALCIUM 9.4 04/12/2012 1406   ALKPHOS 78 09/30/2013 0929   ALKPHOS 67 04/12/2012 1406   AST 28 09/30/2013 0929   AST 19 04/12/2012 1406   ALT 48 09/30/2013 0929   ALT 26 04/12/2012 1406   BILITOT 1.13 09/30/2013 0929   BILITOT 0.6 04/12/2012 1406     Basic Metabolic Panel:  Recent Labs Lab 09/30/13 0929  NA 142  K 4.3  CO2 24  GLUCOSE 102  BUN 12.0  CREATININE 1.0  CALCIUM 9.6   GFR Estimated Creatinine Clearance: 100.1 ml/min (by C-G formula based on Cr of 1). Liver Function Tests:  Recent Labs Lab 09/30/13 0929  AST 28  ALT 48  ALKPHOS 78  BILITOT 1.13  PROT 7.1  ALBUMIN 4.3   CBC:  Recent Labs Lab 09/30/13 0929  WBC 7.8  NEUTROABS 4.6  HGB 16.3  HCT 47.3  MCV 87.8  PLT 202   Studies:  No results found.   RADIOGRAPHIC STUDIES: No results found.  ASSESSMENT: Bryan Mcd[facility] 57 y.o. male with a history of History of lymphoma - Plan: CBC with Differential, Comprehensive metabolic panel (Cmet) - [facility], Lactate dehydrogenase (LDH) - [facility]  Prostate cancer   PLAN:   1. History of non-Hodgkin's lymphoma: continue to be in remission. As he is more than 3.5 years out from the treatment, and he had indolent NHL; and now asymptomatic, there is no indication for routine CT scan. He agreed with this for now and will let us now if he has concerning symptoms.  2. Elevated BP: Counseled him to avoid salty diet and  maintain a bp log to present to his PCP.  Continued dieting and exercise.  3. Daytime somnolence and loud snoring: May benefit from a sleep study.  4. Obesity/fatty liver: Continued dieting and exercise.  Follow-up with GI Dr. Roselyn Bering.  LFTs are ok today.  5.  Follow up: Return visit in about 6 months with labs including CBC, CMP and LDH  All questions were answered. The patient knows to call the clinic with any problems, questions or concerns. We can certainly see the patient much sooner if necessary.  I spent 10 minutes counseling the patient face to face. The total time spent in the appointment was 15 minutes.    Diamonte Stavely, MD 09/30/2013 10:28 AM

## 2013-12-23 ENCOUNTER — Telehealth: Payer: Self-pay | Admitting: Internal Medicine

## 2013-12-23 NOTE — Telephone Encounter (Signed)
pt called to r/s appt..done...pt aware of new d.t °

## 2014-03-21 ENCOUNTER — Telehealth: Payer: Self-pay | Admitting: Hematology and Oncology

## 2014-03-21 ENCOUNTER — Other Ambulatory Visit (HOSPITAL_BASED_OUTPATIENT_CLINIC_OR_DEPARTMENT_OTHER): Payer: Managed Care, Other (non HMO)

## 2014-03-21 ENCOUNTER — Ambulatory Visit (HOSPITAL_BASED_OUTPATIENT_CLINIC_OR_DEPARTMENT_OTHER): Payer: Managed Care, Other (non HMO) | Admitting: Internal Medicine

## 2014-03-21 VITALS — BP 161/81 | HR 55 | Temp 98.0°F | Resp 17 | Ht 71.0 in | Wt 215.5 lb

## 2014-03-21 DIAGNOSIS — Z8579 Personal history of other malignant neoplasms of lymphoid, hematopoietic and related tissues: Secondary | ICD-10-CM

## 2014-03-21 DIAGNOSIS — K7689 Other specified diseases of liver: Secondary | ICD-10-CM

## 2014-03-21 DIAGNOSIS — C8589 Other specified types of non-Hodgkin lymphoma, extranodal and solid organ sites: Secondary | ICD-10-CM

## 2014-03-21 DIAGNOSIS — K760 Fatty (change of) liver, not elsewhere classified: Secondary | ICD-10-CM

## 2014-03-21 DIAGNOSIS — C61 Malignant neoplasm of prostate: Secondary | ICD-10-CM

## 2014-03-21 DIAGNOSIS — I10 Essential (primary) hypertension: Secondary | ICD-10-CM | POA: Insufficient documentation

## 2014-03-21 LAB — COMPREHENSIVE METABOLIC PANEL (CC13)
ALK PHOS: 71 U/L (ref 40–150)
ALT: 34 U/L (ref 0–55)
AST: 24 U/L (ref 5–34)
Albumin: 4 g/dL (ref 3.5–5.0)
Anion Gap: 8 mEq/L (ref 3–11)
BUN: 10 mg/dL (ref 7.0–26.0)
CO2: 26 mEq/L (ref 22–29)
CREATININE: 1 mg/dL (ref 0.7–1.3)
Calcium: 9 mg/dL (ref 8.4–10.4)
Chloride: 108 mEq/L (ref 98–109)
Glucose: 101 mg/dl (ref 70–140)
Potassium: 4.5 mEq/L (ref 3.5–5.1)
Sodium: 142 mEq/L (ref 136–145)
Total Bilirubin: 0.84 mg/dL (ref 0.20–1.20)
Total Protein: 6.9 g/dL (ref 6.4–8.3)

## 2014-03-21 LAB — CBC WITH DIFFERENTIAL/PLATELET
BASO%: 1.1 % (ref 0.0–2.0)
BASOS ABS: 0.1 10*3/uL (ref 0.0–0.1)
EOS%: 2.2 % (ref 0.0–7.0)
Eosinophils Absolute: 0.1 10*3/uL (ref 0.0–0.5)
HEMATOCRIT: 46.7 % (ref 38.4–49.9)
HEMOGLOBIN: 15.7 g/dL (ref 13.0–17.1)
LYMPH%: 38.5 % (ref 14.0–49.0)
MCH: 29.2 pg (ref 27.2–33.4)
MCHC: 33.6 g/dL (ref 32.0–36.0)
MCV: 87 fL (ref 79.3–98.0)
MONO#: 0.4 10*3/uL (ref 0.1–0.9)
MONO%: 6 % (ref 0.0–14.0)
NEUT#: 3.3 10*3/uL (ref 1.5–6.5)
NEUT%: 52.2 % (ref 39.0–75.0)
Platelets: 186 10*3/uL (ref 140–400)
RBC: 5.36 10*6/uL (ref 4.20–5.82)
RDW: 13.1 % (ref 11.0–14.6)
WBC: 6.4 10*3/uL (ref 4.0–10.3)
lymph#: 2.5 10*3/uL (ref 0.9–3.3)

## 2014-03-21 LAB — LACTATE DEHYDROGENASE (CC13): LDH: 156 U/L (ref 125–245)

## 2014-03-21 MED ORDER — AMLODIPINE BESYLATE 5 MG PO TABS: 5.0000 mg | ORAL_TABLET | Freq: Every day | ORAL | Status: DC

## 2014-03-21 NOTE — Progress Notes (Signed)
[facility] NOTE  Irven Shelling, MD [address], [address] [place] [address]  DIAGNOSIS: History of lymphoma - Plan: CBC with Differential, Comprehensive metabolic panel (Cmet) - [facility], Lactate dehydrogenase (LDH) - [facility]  Prostate cancer  Fatty liver  Hypertension - Plan: CBC with Differential, Comprehensive metabolic panel (Cmet) - [facility], Lactate dehydrogenase (LDH) - [facility]  Chief Complaint  Patient presents with  . History of lymphoma   PAST THERAPY: Status post 6 cycles of Rituxan/CVP; last cycle given 11/01/2008. He achieved very good partial response on follow-up CT scan and PET/CT. He received two years of maintenance Rituxan with 4 weekly doses every 6 months. He finished in 04/2011.   CURRENT THERAPY: Watchful observation.   INTERVAL HISTORY:  Bryan Mcdowell 58 y.o. male returns for regular follow up. He was last seen by me on 09/30/2013.  He is also being followed by Dr. Laurance Flatten at [facility] who he saw yesterday.  There were no recommendations.  He suggested continued diet and exercise.   He has also seen Dr. Elie Confer for a diagnosis of fatty liver disease.  He denies any recent hospitalization or emergency room visits.  He reports noticing an increase in his blood pressure measurements. He lost 9 lbs intentionally.   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 His wife recently was diagnosed with breast cancer and continuing close follow up.    Patient denies fever, anorexia, weight loss, fatigue, headache, visual changes, confusion, drenching night sweats, palpable lymph node swelling, mucositis, odynophagia, dysphagia, nausea vomiting, jaundice, chest pain, palpitation, shortness of breath, dyspnea on exertion, productive cough, gum bleeding, epistaxis, hematemesis, hemoptysis, abdominal pain, abdominal swelling, early satiety, melena, hematochezia, hematuria, skin rash, spontaneous bleeding, joint swelling, joint  pain, heat or cold intolerance, bowel bladder incontinence, back pain, focal motor weakness, paresthesia, depression.   MEDICAL HISTORY: Past Medical History  Diagnosis Date  . Prostate cancer 2000  . Non Hodgkin's lymphoma 2009    marginal zone B-cell lymphoma; s/p R-CVP with CR   . History of lymphoma 04/12/2012    INTERIM HISTORY: has History of lymphoma; Prostate cancer; and Fatty liver on his problem list.    ALLERGIES:  is allergic to iodine and other.  MEDICATIONS: has a current medication list which includes the following prescription(s): aspirin, omeprazole, and amlodipine.  SURGICAL HISTORY: No past surgical history on file.  REVIEW OF SYSTEMS:   Constitutional: Denies fevers, chills or abnormal weight loss Eyes: Denies blurriness of vision Ears, nose, mouth, throat, and face: Denies mucositis or sore throat Respiratory: Denies cough, dyspnea or wheezes Cardiovascular: Denies palpitation, chest discomfort or lower extremity swelling Gastrointestinal:  Denies nausea, heartburn or change in bowel habits Skin: Denies abnormal skin rashes Lymphatics: Denies new lymphadenopathy or easy bruising Neurological:Denies numbness, tingling or new weaknesses Behavioral/Psych: Mood is stable, no new changes  All other systems were reviewed with the patient and are negative.  PHYSICAL EXAMINATION: ECOG PERFORMANCE STATUS: 0 - Asymptomatic  Blood pressure 161/81, pulse 55, temperature 98 F (36.7 C), temperature source Oral, resp. rate 17, height 5\' 11"  (1.803 m), weight 215 lb 8 oz (97.75 kg), SpO2 100.00%.  GENERAL:alert, no distress and comfortable, well developed and well nourished SKIN: skin color, texture, turgor are normal, no rashes or significant lesions EYES: normal, Conjunctiva are pink and non-injected, sclera clear OROPHARYNX:no exudate, no erythema and lips, buccal mucosa, and tongue normal  NECK: supple, thyroid normal size, non-tender, without nodularity LYMPH:  no  palpable lymphadenopathy in the cervical, axillary or supraclavicular LUNGS: clear to auscultation and percussion with normal breathing effort HEART: regular rate & rhythm and no murmurs and no lower extremity edema ABDOMEN:abdomen soft, non-tender and normal bowel sounds; Abdominal scar well-healed.  Musculoskeletal:no cyanosis of digits and no clubbing  NEURO: alert & oriented x 3 with fluent speech, no focal motor/sensory deficits  LABORATORY DATA: Results for orders placed in visit on 03/21/14 (from the past 48 hour(s))  CBC WITH DIFFERENTIAL     Status: None   Collection Time    03/21/14  8:09 AM      Result Value Ref Range   WBC 6.4  4.0 - 10.3 10e3/uL   NEUT# 3.3  1.5 - 6.5 10e3/uL   HGB 15.7  13.0 - 17.1 g/dL   HCT 46.7  38.4 - 49.9 %   Platelets 186  140 - 400 10e3/uL   MCV 87.0  79.3 - 98.0 fL   MCH 29.2  27.2 - 33.4 pg   MCHC 33.6  32.0 - 36.0 g/dL   RBC 5.36  4.20 - 5.82 10e6/uL   RDW 13.1  11.0 - 14.6 %   lymph# 2.5  0.9 - 3.3 10e3/uL   MONO# 0.4  0.1 - 0.9 10e3/uL   Eosinophils Absolute 0.1  0.0 - 0.5 10e3/uL   Basophils Absolute 0.1  0.0 - 0.1 10e3/uL   NEUT% 52.2  39.0 - 75.0 %   LYMPH% 38.5  14.0 - 49.0 %   MONO% 6.0  0.0 - 14.0 %   EOS% 2.2  0.0 - 7.0 %   BASO% 1.1  0.0 - 2.0 %  LACTATE DEHYDROGENASE (CC13)     Status: None   Collection Time    03/21/14  8:09 AM      Result Value Ref Range   LDH 156  125 - 245 U/L  COMPREHENSIVE METABOLIC PANEL (HT34)     Status: None   Collection Time    03/21/14  8:09 AM      Result Value Ref Range   Sodium 142  136 - 145 mEq/L   Potassium 4.5  3.5 - 5.1 mEq/L   Chloride 108  98 - 109 mEq/L   CO2 26  22 - 29 mEq/L   Glucose 101  70 - 140 mg/dl   BUN 10.0  7.0 - 26.0 mg/dL   Creatinine 1.0  0.7 - 1.3 mg/dL   Total Bilirubin 0.84  0.20 - 1.20 mg/dL   Alkaline Phosphatase 71  40 - 150 U/L   AST 24  5 - 34 U/L   ALT 34  0 - 55 U/L   Total Protein 6.9  6.4 - 8.3 g/dL   Albumin 4.0  3.5 - 5.0 g/dL   Calcium 9.0  8.4  - 10.4 mg/dL   Anion Gap 8  3 - 11 mEq/L    Labs:  Lab Results  Component Value Date   WBC 6.4 03/21/2014   HGB 15.7 03/21/2014   HCT 46.7 03/21/2014   MCV 87.0 03/21/2014   PLT 186 03/21/2014   NEUTROABS 3.3 03/21/2014      Chemistry      Component Value Date/Time   NA 142 03/21/2014 0809   NA 143 04/12/2012 1406   K 4.5 03/21/2014 0809   K 4.1 04/12/2012 1406   CL 109* 04/01/2013 0905   CL 106 04/12/2012 1406   CO2 26 03/21/2014 0809   CO2 27 04/12/2012 1406   BUN  10.0 03/21/2014 0809   BUN 9 04/12/2012 1406   CREATININE 1.0 03/21/2014 0809   CREATININE 0.96 04/12/2012 1406      Component Value Date/Time   CALCIUM 9.0 03/21/2014 0809   CALCIUM 9.4 04/12/2012 1406   ALKPHOS 71 03/21/2014 0809   ALKPHOS 67 04/12/2012 1406   AST 24 03/21/2014 0809   AST 19 04/12/2012 1406   ALT 34 03/21/2014 0809   ALT 26 04/12/2012 1406   BILITOT 0.84 03/21/2014 0809   BILITOT 0.6 04/12/2012 1406     Basic Metabolic Panel:  Recent Labs Lab 03/21/14 0809  NA 142  K 4.5  CO2 26  GLUCOSE 101  BUN 10.0  CREATININE 1.0  CALCIUM 9.0   GFR Estimated Creatinine Clearance: 97.2 ml/min (by C-G formula based on Cr of 1). Liver Function Tests:  Recent Labs Lab 03/21/14 0809  AST 24  ALT 34  ALKPHOS 71  BILITOT 0.84  PROT 6.9  ALBUMIN 4.0   CBC:  Recent Labs Lab 03/21/14 0809  WBC 6.4  NEUTROABS 3.3  HGB 15.7  HCT 46.7  MCV 87.0  PLT 186   Studies:  No results found.   RADIOGRAPHIC STUDIES: No results found.  ASSESSMENT: Bryan Mcdowell 58 y.o. male with a history of History of lymphoma - Plan: CBC with Differential, Comprehensive metabolic panel (Cmet) - [facility], Lactate dehydrogenase (LDH) - [facility]  Prostate cancer  Fatty liver  Hypertension - Plan: CBC with Differential, Comprehensive metabolic panel (Cmet) - [facility], Lactate dehydrogenase (LDH) - [facility]   PLAN:   1. History of non-Hodgkin's lymphoma: continue to be in remission. As he is more than 3.5 years out from the treatment, and he had indolent NHL;  and now asymptomatic, there is no indication for routine CT scan. He agreed with this for now and will let us now if he has concerning symptoms.  2. Elevated BP: Counseled him to avoid salty diet and maintain a bp log to present to his PCP.  Continued dieting and exercise. Start Amlodipine 5 mg daily (#30 provided with one refill).  He will follow with his PCP. 3. Obesity/fatty liver: Continued dieting and exercise.  Follow-up with GI Dr. Elie Confer.  LFTs are ok today.  4.  Follow up: Return visit in about 6 months with labs including CBC, CMP and LDH  All questions were answered. The patient knows to call the clinic with any problems, questions or concerns. We can certainly see the patient much sooner if necessary.  I spent 10 minutes counseling the patient face to face. The total time spent in the appointment was 15 minutes.    Concha Norway, MD 03/21/2014 8:57 AM

## 2014-03-21 NOTE — Telephone Encounter (Signed)
s.w. pt wife adn advised on DEC appt...ok adn aware...she will have him call back if appt not ok

## 2014-04-18 ENCOUNTER — Ambulatory Visit: Payer: Managed Care, Other (non HMO)

## 2014-04-18 ENCOUNTER — Other Ambulatory Visit: Payer: Managed Care, Other (non HMO)

## 2014-09-11 ENCOUNTER — Telehealth: Payer: Self-pay | Admitting: Hematology and Oncology

## 2014-09-11 NOTE — Telephone Encounter (Signed)
pt called to r/s appt..done...pt aware of new d.t °

## 2014-09-20 ENCOUNTER — Ambulatory Visit: Payer: Managed Care, Other (non HMO) | Admitting: Hematology and Oncology

## 2014-09-20 ENCOUNTER — Other Ambulatory Visit: Payer: Managed Care, Other (non HMO)

## 2014-11-14 ENCOUNTER — Other Ambulatory Visit: Payer: Self-pay | Admitting: Hematology and Oncology

## 2014-11-14 DIAGNOSIS — Z8579 Personal history of other malignant neoplasms of lymphoid, hematopoietic and related tissues: Secondary | ICD-10-CM

## 2014-11-15 ENCOUNTER — Ambulatory Visit (HOSPITAL_BASED_OUTPATIENT_CLINIC_OR_DEPARTMENT_OTHER): Payer: Managed Care, Other (non HMO) | Admitting: Hematology and Oncology

## 2014-11-15 ENCOUNTER — Encounter: Payer: Self-pay | Admitting: Hematology and Oncology

## 2014-11-15 ENCOUNTER — Other Ambulatory Visit (HOSPITAL_BASED_OUTPATIENT_CLINIC_OR_DEPARTMENT_OTHER): Payer: Managed Care, Other (non HMO)

## 2014-11-15 VITALS — BP 144/79 | HR 65 | Temp 98.4°F | Resp 18 | Ht 71.0 in | Wt 221.9 lb

## 2014-11-15 DIAGNOSIS — C61 Malignant neoplasm of prostate: Secondary | ICD-10-CM

## 2014-11-15 DIAGNOSIS — Z8579 Personal history of other malignant neoplasms of lymphoid, hematopoietic and related tissues: Secondary | ICD-10-CM

## 2014-11-15 LAB — COMPREHENSIVE METABOLIC PANEL (CC13)
ALT: 38 U/L (ref 0–55)
ANION GAP: 10 meq/L (ref 3–11)
AST: 24 U/L (ref 5–34)
Albumin: 4 g/dL (ref 3.5–5.0)
Alkaline Phosphatase: 70 U/L (ref 40–150)
BILIRUBIN TOTAL: 0.63 mg/dL (ref 0.20–1.20)
BUN: 11.9 mg/dL (ref 7.0–26.0)
CO2: 23 meq/L (ref 22–29)
Calcium: 9 mg/dL (ref 8.4–10.4)
Chloride: 109 mEq/L (ref 98–109)
Creatinine: 1 mg/dL (ref 0.7–1.3)
EGFR: 80 mL/min/{1.73_m2} — AB (ref >90)
GLUCOSE: 101 mg/dL (ref 70–140)
Potassium: 4.7 mEq/L (ref 3.5–5.1)
SODIUM: 142 meq/L (ref 136–145)
TOTAL PROTEIN: 6.8 g/dL (ref 6.4–8.3)

## 2014-11-15 LAB — CBC WITH DIFFERENTIAL/PLATELET
BASO%: 0.8 % (ref 0.0–2.0)
BASOS ABS: 0.1 10*3/uL (ref 0.0–0.1)
EOS ABS: 0.2 10*3/uL (ref 0.0–0.5)
EOS%: 2.2 % (ref 0.0–7.0)
HEMATOCRIT: 46.8 % (ref 38.4–49.9)
HEMOGLOBIN: 15.4 g/dL (ref 13.0–17.1)
LYMPH%: 34.7 % (ref 14.0–49.0)
MCH: 28.6 pg (ref 27.2–33.4)
MCHC: 32.9 g/dL (ref 32.0–36.0)
MCV: 87 fL (ref 79.3–98.0)
MONO#: 0.4 10*3/uL (ref 0.1–0.9)
MONO%: 5.2 % (ref 0.0–14.0)
NEUT%: 57.1 % (ref 39.0–75.0)
NEUTROS ABS: 4.5 10*3/uL (ref 1.5–6.5)
PLATELETS: 189 10*3/uL (ref 140–400)
RBC: 5.38 10*6/uL (ref 4.20–5.82)
RDW: 12.7 % (ref 11.0–14.6)
WBC: 7.9 10*3/uL (ref 4.0–10.3)
lymph#: 2.8 10*3/uL (ref 0.9–3.3)

## 2014-11-15 LAB — LACTATE DEHYDROGENASE (CC13): LDH: 179 U/L (ref 125–245)

## 2014-11-16 NOTE — Assessment & Plan Note (Signed)
He has no signs of disease recurrence. I will discharge patient from the clinic and recommend follow-up with PCP only.

## 2014-11-16 NOTE — Progress Notes (Signed)
[facility] progress notes  Patient Care Team: Irven Shelling, MD as PCP - General (Internal Medicine) Jettie Booze, MD (Cardiology) [facility] Grant Fontana., MD as Attending Physician (Urology) Heath Lark, MD as Consulting Physician (Hematology and Oncology)  CHIEF COMPLAINTS/PURPOSE OF VISIT:  History lymphoma and prostate cancer  HISTORY OF PRESENTING ILLNESS:  Bryan Mcdowell 59 y.o. male was transferred to my care after his prior physician has left.  I reviewed the patient's records extensive and collaborated the history with the patient. Summary of his history is as follows: This patient was diagnosed with prostate cancer in 2001 status post radical prostatectomy without the need for adjuvant treatment. 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 He achieved very good partial response on follow-up CT scan and PET/CT. He received two years of maintenance Rituxan with 4 weekly doses every 6 months. He finished in 04/2011. His last imaging study in 2013 show no evidence of disease He denies new lymphadenopathy. No recent infection. He denies any urination difficulties. MEDICAL HISTORY:  Past Medical History  Diagnosis Date  . Prostate cancer 2000  . Non Hodgkin's lymphoma 2009    marginal zone B-cell lymphoma; s/p R-CVP with CR   . History of lymphoma 04/12/2012  . Hypertension     SURGICAL HISTORY: Past Surgical History  Procedure Laterality Date  . Prostatectomy      SOCIAL HISTORY: History   Social History  . Marital Status: Married    Spouse Name: N/A    Number of Children: N/A  . Years of Education: N/A   Occupational History  . Not on file.   Social History Main Topics  . Smoking status: Never Smoker   . Smokeless tobacco: Never Used  . Alcohol Use: 4.2 oz/week    7 Shots of liquor per week  . Drug Use: No  . Sexual Activity: Not on file      Comment: Lives in [place], work in [employer] fund[employer]   Other Topics Concern  . Not on file   Social History Narrative    FAMILY HISTORY: Family History  Problem Relation Age of Onset  . Cancer Paternal Aunt     breast ca  . Cancer Maternal Grandfather     prostate ca    ALLERGIES:  is allergic to iodine and other.  MEDICATIONS:  Current Outpatient Prescriptions  Medication Sig Dispense Refill  . aspirin 81 MG tablet Take 81 mg by mouth daily.    Marland Kitchen CIALIS 20 MG tablet   10  . famotidine (PEPCID AC) 10 MG chewable tablet Chew 10 mg by mouth 2 (two) times daily.    . valsartan (DIOVAN) 160 MG tablet   1   No current facility-administered medications for this visit.    REVIEW OF SYSTEMS:   Constitutional: Denies fevers, chills or abnormal night sweats Eyes: Denies blurriness of vision, double vision or watery eyes Ears, nose, mouth, throat, and face: Denies mucositis or sore throat Respiratory: Denies cough, dyspnea or wheezes Cardiovascular: Denies palpitation, chest discomfort or lower extremity swelling Gastrointestinal:  Denies nausea, heartburn or change in bowel habits Skin: Denies abnormal skin rashes Lymphatics: Denies new lymphadenopathy or easy bruising Neurological:Denies numbness, tingling or new weaknesses Behavioral/Psych: Mood is stable, no new changes  All other systems were reviewed with the patient and are negative.  PHYSICAL EXAMINATION: ECOG PERFORMANCE STATUS: 0 - Asymptomatic  Filed Vitals:   11/15/14 0815  BP:  144/79  Pulse: 65  Temp: 98.4 F (36.9 C)  Resp: 18   Filed Weights   11/15/14 0815  Weight: 221 lb 14.4 oz (100.653 kg)    GENERAL:alert, no distress and comfortable SKIN: skin color, texture, turgor are normal, no rashes or significant lesions EYES: normal, conjunctiva are pink and non-injected, sclera clear OROPHARYNX:no exudate, normal lips, buccal mucosa, and tongue  NECK: supple, thyroid normal size, non-tender, without  nodularity LYMPH:  no palpable lymphadenopathy in the cervical, axillary or inguinal LUNGS: clear to auscultation and percussion with normal breathing effort HEART: regular rate & rhythm and no murmurs without lower extremity edema ABDOMEN:abdomen soft, non-tender and normal bowel sounds Musculoskeletal:no cyanosis of digits and no clubbing  PSYCH: alert & oriented x 3 with fluent speech NEURO: no focal motor/sensory deficits  LABORATORY DATA:  I have reviewed the data as listed Lab Results  Component Value Date   WBC 7.9 11/15/2014   HGB 15.4 11/15/2014   HCT 46.8 11/15/2014   MCV 87.0 11/15/2014   PLT 189 11/15/2014    Recent Labs  03/21/14 0809 11/15/14 0759  NA 142 142  K 4.5 4.7  CO2 26 23  GLUCOSE 101 101  BUN 10.0 11.9  CREATININE 1.0 1.0  CALCIUM 9.0 9.0  PROT 6.9 6.8  ALBUMIN 4.0 4.0  AST 24 24  ALT 34 38  ALKPHOS 71 70  BILITOT 0.84 0.63   ASSESSMENT & PLAN:  History of lymphoma He has no signs of disease recurrence. I will discharge patient from the clinic and recommend follow-up with PCP only.   Prostate cancer He is under close surveillance monitoring program with PCP. I will defer to his PCP for future follow-up.   All questions were answered. The patient knows to call the clinic with any problems, questions or concerns. I spent 15 minutes counseling the patient face to face. The total time spent in the appointment was 20 minutes and more than 50% was on counseling.     Glouster, [place], MD 11/16/2014 8:57 AM

## 2014-11-16 NOTE — Assessment & Plan Note (Signed)
He is under close surveillance monitoring program with PCP. I will defer to his PCP for future follow-up.

## 2020-11-26 DIAGNOSIS — I10 Essential (primary) hypertension: Secondary | ICD-10-CM | POA: Diagnosis not present

## 2020-11-26 DIAGNOSIS — N529 Male erectile dysfunction, unspecified: Secondary | ICD-10-CM | POA: Diagnosis not present

## 2020-11-26 DIAGNOSIS — Z8572 Personal history of non-Hodgkin lymphomas: Secondary | ICD-10-CM | POA: Diagnosis not present

## 2020-11-26 DIAGNOSIS — Z0000 Encounter for general adult medical examination without abnormal findings: Secondary | ICD-10-CM | POA: Diagnosis not present

## 2020-11-26 DIAGNOSIS — Z8546 Personal history of malignant neoplasm of prostate: Secondary | ICD-10-CM | POA: Diagnosis not present

## 2020-11-26 DIAGNOSIS — Z13220 Encounter for screening for lipoid disorders: Secondary | ICD-10-CM | POA: Diagnosis not present

## 2020-11-29 DIAGNOSIS — Z23 Encounter for immunization: Secondary | ICD-10-CM | POA: Diagnosis not present

## 2021-04-10 DIAGNOSIS — J019 Acute sinusitis, unspecified: Secondary | ICD-10-CM | POA: Diagnosis not present

## 2021-08-29 DIAGNOSIS — Z23 Encounter for immunization: Secondary | ICD-10-CM | POA: Diagnosis not present

## 2021-10-01 DIAGNOSIS — C8588 Other specified types of non-Hodgkin lymphoma, lymph nodes of multiple sites: Secondary | ICD-10-CM | POA: Diagnosis not present

## 2021-11-08 DIAGNOSIS — C8598 Non-Hodgkin lymphoma, unspecified, lymph nodes of multiple sites: Secondary | ICD-10-CM | POA: Diagnosis not present

## 2021-11-08 DIAGNOSIS — C8588 Other specified types of non-Hodgkin lymphoma, lymph nodes of multiple sites: Secondary | ICD-10-CM | POA: Diagnosis not present

## 2022-05-19 DIAGNOSIS — Z23 Encounter for immunization: Secondary | ICD-10-CM | POA: Diagnosis not present

## 2022-05-19 DIAGNOSIS — Z0000 Encounter for general adult medical examination without abnormal findings: Secondary | ICD-10-CM | POA: Diagnosis not present

## 2022-05-19 DIAGNOSIS — I10 Essential (primary) hypertension: Secondary | ICD-10-CM | POA: Diagnosis not present

## 2022-05-19 DIAGNOSIS — Z125 Encounter for screening for malignant neoplasm of prostate: Secondary | ICD-10-CM | POA: Diagnosis not present

## 2022-07-01 DIAGNOSIS — G43109 Migraine with aura, not intractable, without status migrainosus: Secondary | ICD-10-CM | POA: Diagnosis not present

## 2022-11-06 DIAGNOSIS — C8588 Other specified types of non-Hodgkin lymphoma, lymph nodes of multiple sites: Secondary | ICD-10-CM | POA: Diagnosis not present

## 2023-02-25 DIAGNOSIS — H01003 Unspecified blepharitis right eye, unspecified eyelid: Secondary | ICD-10-CM | POA: Diagnosis not present

## 2023-02-25 DIAGNOSIS — H01006 Unspecified blepharitis left eye, unspecified eyelid: Secondary | ICD-10-CM | POA: Diagnosis not present

## 2023-08-31 DIAGNOSIS — H43813 Vitreous degeneration, bilateral: Secondary | ICD-10-CM | POA: Diagnosis not present

## 2023-09-18 DIAGNOSIS — I10 Essential (primary) hypertension: Secondary | ICD-10-CM | POA: Diagnosis not present

## 2023-09-18 DIAGNOSIS — E7800 Pure hypercholesterolemia, unspecified: Secondary | ICD-10-CM | POA: Diagnosis not present

## 2023-09-18 DIAGNOSIS — Z8572 Personal history of non-Hodgkin lymphomas: Secondary | ICD-10-CM | POA: Diagnosis not present

## 2023-09-18 DIAGNOSIS — Z8546 Personal history of malignant neoplasm of prostate: Secondary | ICD-10-CM | POA: Diagnosis not present

## 2023-09-18 DIAGNOSIS — Z23 Encounter for immunization: Secondary | ICD-10-CM | POA: Diagnosis not present

## 2023-09-18 DIAGNOSIS — N529 Male erectile dysfunction, unspecified: Secondary | ICD-10-CM | POA: Diagnosis not present

## 2023-09-18 DIAGNOSIS — Z0000 Encounter for general adult medical examination without abnormal findings: Secondary | ICD-10-CM | POA: Diagnosis not present

## 2023-10-30 DIAGNOSIS — H40013 Open angle with borderline findings, low risk, bilateral: Secondary | ICD-10-CM | POA: Diagnosis not present

## 2023-11-05 DIAGNOSIS — R7989 Other specified abnormal findings of blood chemistry: Secondary | ICD-10-CM | POA: Diagnosis not present

## 2023-11-05 DIAGNOSIS — C8588 Other specified types of non-Hodgkin lymphoma, lymph nodes of multiple sites: Secondary | ICD-10-CM | POA: Diagnosis not present

## 2023-11-05 DIAGNOSIS — Z8616 Personal history of COVID-19: Secondary | ICD-10-CM | POA: Diagnosis not present

## 2023-11-05 DIAGNOSIS — Z79899 Other long term (current) drug therapy: Secondary | ICD-10-CM | POA: Diagnosis not present

## 2023-11-20 DIAGNOSIS — H40033 Anatomical narrow angle, bilateral: Secondary | ICD-10-CM | POA: Diagnosis not present

## 2023-12-07 DIAGNOSIS — H40031 Anatomical narrow angle, right eye: Secondary | ICD-10-CM | POA: Diagnosis not present

## 2023-12-14 DIAGNOSIS — H40032 Anatomical narrow angle, left eye: Secondary | ICD-10-CM | POA: Diagnosis not present

## 2024-01-18 DIAGNOSIS — E7800 Pure hypercholesterolemia, unspecified: Secondary | ICD-10-CM | POA: Diagnosis not present

## 2024-01-22 DIAGNOSIS — H0012 Chalazion right lower eyelid: Secondary | ICD-10-CM | POA: Diagnosis not present

## 2024-02-05 DIAGNOSIS — H40033 Anatomical narrow angle, bilateral: Secondary | ICD-10-CM | POA: Diagnosis not present

## 2024-02-29 DIAGNOSIS — H40033 Anatomical narrow angle, bilateral: Secondary | ICD-10-CM | POA: Diagnosis not present
